# Patient Record
Sex: Female | Born: 1960 | Race: White | Hispanic: No | Smoking: Never smoker
Health system: Southern US, Community
[De-identification: ages and names within clinical notes are randomized; demographics above are authoritative.]

## PROBLEM LIST (undated history)

## (undated) DIAGNOSIS — G43909 Migraine, unspecified, not intractable, without status migrainosus: Secondary | ICD-10-CM

## (undated) DIAGNOSIS — K219 Gastro-esophageal reflux disease without esophagitis: Secondary | ICD-10-CM

## (undated) DIAGNOSIS — R112 Nausea with vomiting, unspecified: Secondary | ICD-10-CM

## (undated) DIAGNOSIS — F419 Anxiety disorder, unspecified: Secondary | ICD-10-CM

## (undated) DIAGNOSIS — R51 Headache: Secondary | ICD-10-CM

## (undated) DIAGNOSIS — Z9889 Other specified postprocedural states: Secondary | ICD-10-CM

## (undated) DIAGNOSIS — G4700 Insomnia, unspecified: Secondary | ICD-10-CM

## (undated) HISTORY — PX: TONSILLECTOMY: SUR1361

## (undated) HISTORY — PX: ELBOW ARTHROSCOPY: SUR87

---

## 1985-07-21 HISTORY — PX: DIAGNOSTIC LAPAROSCOPY: SUR761

## 1985-07-21 HISTORY — PX: APPENDECTOMY: SHX54

## 1997-11-03 ENCOUNTER — Other Ambulatory Visit: Admission: RE | Admit: 1997-11-03 | Discharge: 1997-11-03 | Payer: Self-pay | Admitting: Obstetrics and Gynecology

## 1997-11-06 ENCOUNTER — Other Ambulatory Visit: Admission: RE | Admit: 1997-11-06 | Discharge: 1997-11-06 | Payer: Self-pay | Admitting: Obstetrics and Gynecology

## 1998-11-02 ENCOUNTER — Ambulatory Visit (HOSPITAL_COMMUNITY): Admission: RE | Admit: 1998-11-02 | Discharge: 1998-11-02 | Payer: Self-pay | Admitting: Gastroenterology

## 1998-11-14 ENCOUNTER — Ambulatory Visit (HOSPITAL_COMMUNITY): Admission: RE | Admit: 1998-11-14 | Discharge: 1998-11-14 | Payer: Self-pay | Admitting: Obstetrics and Gynecology

## 1999-01-23 ENCOUNTER — Ambulatory Visit (HOSPITAL_COMMUNITY): Admission: RE | Admit: 1999-01-23 | Discharge: 1999-01-23 | Payer: Self-pay | Admitting: Gastroenterology

## 1999-01-24 ENCOUNTER — Other Ambulatory Visit: Admission: RE | Admit: 1999-01-24 | Discharge: 1999-01-24 | Payer: Self-pay | Admitting: Gynecology

## 1999-10-04 ENCOUNTER — Ambulatory Visit (HOSPITAL_BASED_OUTPATIENT_CLINIC_OR_DEPARTMENT_OTHER): Admission: RE | Admit: 1999-10-04 | Discharge: 1999-10-04 | Payer: Self-pay | Admitting: General Surgery

## 1999-10-11 ENCOUNTER — Encounter: Payer: Self-pay | Admitting: Neurology

## 1999-10-11 ENCOUNTER — Encounter: Admission: RE | Admit: 1999-10-11 | Discharge: 1999-10-11 | Payer: Self-pay | Admitting: Neurology

## 2000-01-27 ENCOUNTER — Other Ambulatory Visit: Admission: RE | Admit: 2000-01-27 | Discharge: 2000-01-27 | Payer: Self-pay | Admitting: Obstetrics and Gynecology

## 2000-08-05 ENCOUNTER — Ambulatory Visit (HOSPITAL_COMMUNITY): Admission: RE | Admit: 2000-08-05 | Discharge: 2000-08-05 | Payer: Self-pay | Admitting: Obstetrics and Gynecology

## 2001-01-26 ENCOUNTER — Other Ambulatory Visit: Admission: RE | Admit: 2001-01-26 | Discharge: 2001-01-26 | Payer: Self-pay | Admitting: Obstetrics and Gynecology

## 2002-02-10 ENCOUNTER — Other Ambulatory Visit: Admission: RE | Admit: 2002-02-10 | Discharge: 2002-02-10 | Payer: Self-pay | Admitting: Obstetrics and Gynecology

## 2002-08-10 ENCOUNTER — Ambulatory Visit (HOSPITAL_COMMUNITY): Admission: RE | Admit: 2002-08-10 | Discharge: 2002-08-10 | Payer: Self-pay | Admitting: Obstetrics and Gynecology

## 2002-12-06 ENCOUNTER — Encounter: Payer: Self-pay | Admitting: Obstetrics and Gynecology

## 2002-12-06 ENCOUNTER — Ambulatory Visit (HOSPITAL_COMMUNITY): Admission: RE | Admit: 2002-12-06 | Discharge: 2002-12-06 | Payer: Self-pay | Admitting: Obstetrics and Gynecology

## 2003-02-23 ENCOUNTER — Other Ambulatory Visit: Admission: RE | Admit: 2003-02-23 | Discharge: 2003-02-23 | Payer: Self-pay | Admitting: Obstetrics and Gynecology

## 2003-12-19 ENCOUNTER — Ambulatory Visit (HOSPITAL_COMMUNITY): Admission: RE | Admit: 2003-12-19 | Discharge: 2003-12-19 | Payer: Self-pay | Admitting: Obstetrics and Gynecology

## 2004-02-20 ENCOUNTER — Encounter: Payer: Self-pay | Admitting: Gastroenterology

## 2004-02-27 ENCOUNTER — Other Ambulatory Visit: Admission: RE | Admit: 2004-02-27 | Discharge: 2004-02-27 | Payer: Self-pay | Admitting: Obstetrics and Gynecology

## 2004-12-31 ENCOUNTER — Ambulatory Visit (HOSPITAL_COMMUNITY): Admission: RE | Admit: 2004-12-31 | Discharge: 2004-12-31 | Payer: Self-pay | Admitting: Obstetrics and Gynecology

## 2005-03-05 ENCOUNTER — Other Ambulatory Visit: Admission: RE | Admit: 2005-03-05 | Discharge: 2005-03-05 | Payer: Self-pay | Admitting: Obstetrics and Gynecology

## 2005-03-27 ENCOUNTER — Encounter: Admission: RE | Admit: 2005-03-27 | Discharge: 2005-06-25 | Payer: Self-pay | Admitting: Obstetrics and Gynecology

## 2006-01-02 ENCOUNTER — Ambulatory Visit (HOSPITAL_COMMUNITY): Admission: RE | Admit: 2006-01-02 | Discharge: 2006-01-02 | Payer: Self-pay | Admitting: Obstetrics and Gynecology

## 2006-03-06 ENCOUNTER — Other Ambulatory Visit: Admission: RE | Admit: 2006-03-06 | Discharge: 2006-03-06 | Payer: Self-pay | Admitting: Obstetrics and Gynecology

## 2006-10-12 ENCOUNTER — Ambulatory Visit: Payer: Self-pay | Admitting: Gastroenterology

## 2006-11-02 ENCOUNTER — Ambulatory Visit: Payer: Self-pay | Admitting: Gastroenterology

## 2006-11-03 ENCOUNTER — Encounter (INDEPENDENT_AMBULATORY_CARE_PROVIDER_SITE_OTHER): Payer: Self-pay | Admitting: Specialist

## 2006-11-03 ENCOUNTER — Encounter (INDEPENDENT_AMBULATORY_CARE_PROVIDER_SITE_OTHER): Payer: Self-pay | Admitting: *Deleted

## 2006-11-03 ENCOUNTER — Ambulatory Visit: Payer: Self-pay | Admitting: Gastroenterology

## 2006-11-13 ENCOUNTER — Ambulatory Visit: Payer: Self-pay | Admitting: Gastroenterology

## 2006-11-13 LAB — CONVERTED CEMR LAB
Basophils Absolute: 0 10*3/uL (ref 0.0–0.1)
Eosinophils Absolute: 0.3 10*3/uL (ref 0.0–0.6)
Eosinophils Relative: 3.5 % (ref 0.0–5.0)
HCT: 35.7 % — ABNORMAL LOW (ref 36.0–46.0)
Hemoglobin: 12.6 g/dL (ref 12.0–15.0)
Lymphocytes Relative: 31 % (ref 12.0–46.0)
MCHC: 35.3 g/dL (ref 30.0–36.0)
MCV: 95.8 fL (ref 78.0–100.0)
Monocytes Absolute: 0.6 10*3/uL (ref 0.2–0.7)
Neutrophils Relative %: 57.6 % (ref 43.0–77.0)
WBC: 7.6 10*3/uL (ref 4.5–10.5)

## 2006-11-30 ENCOUNTER — Ambulatory Visit: Payer: Self-pay | Admitting: Gastroenterology

## 2007-01-20 ENCOUNTER — Ambulatory Visit (HOSPITAL_COMMUNITY): Admission: RE | Admit: 2007-01-20 | Discharge: 2007-01-20 | Payer: Self-pay | Admitting: Obstetrics and Gynecology

## 2007-03-09 ENCOUNTER — Other Ambulatory Visit: Admission: RE | Admit: 2007-03-09 | Discharge: 2007-03-09 | Payer: Self-pay | Admitting: Obstetrics and Gynecology

## 2008-02-22 ENCOUNTER — Ambulatory Visit (HOSPITAL_COMMUNITY): Admission: RE | Admit: 2008-02-22 | Discharge: 2008-02-22 | Payer: Self-pay | Admitting: Obstetrics and Gynecology

## 2008-02-22 IMAGING — MG MM DIGITAL SCREENING BILAT
4 series · 4 of 4 positions shown · non-contrast
Comparison: Prior studies.

DG SCREEN MAMMOGRAM BILATERAL
Bilateral CC and MLO view(s) were taken.
Technologist: LAMBERTIN, RT, RM

DIGITAL SCREENING MAMMOGRAM WITH CAD:

[R CC]
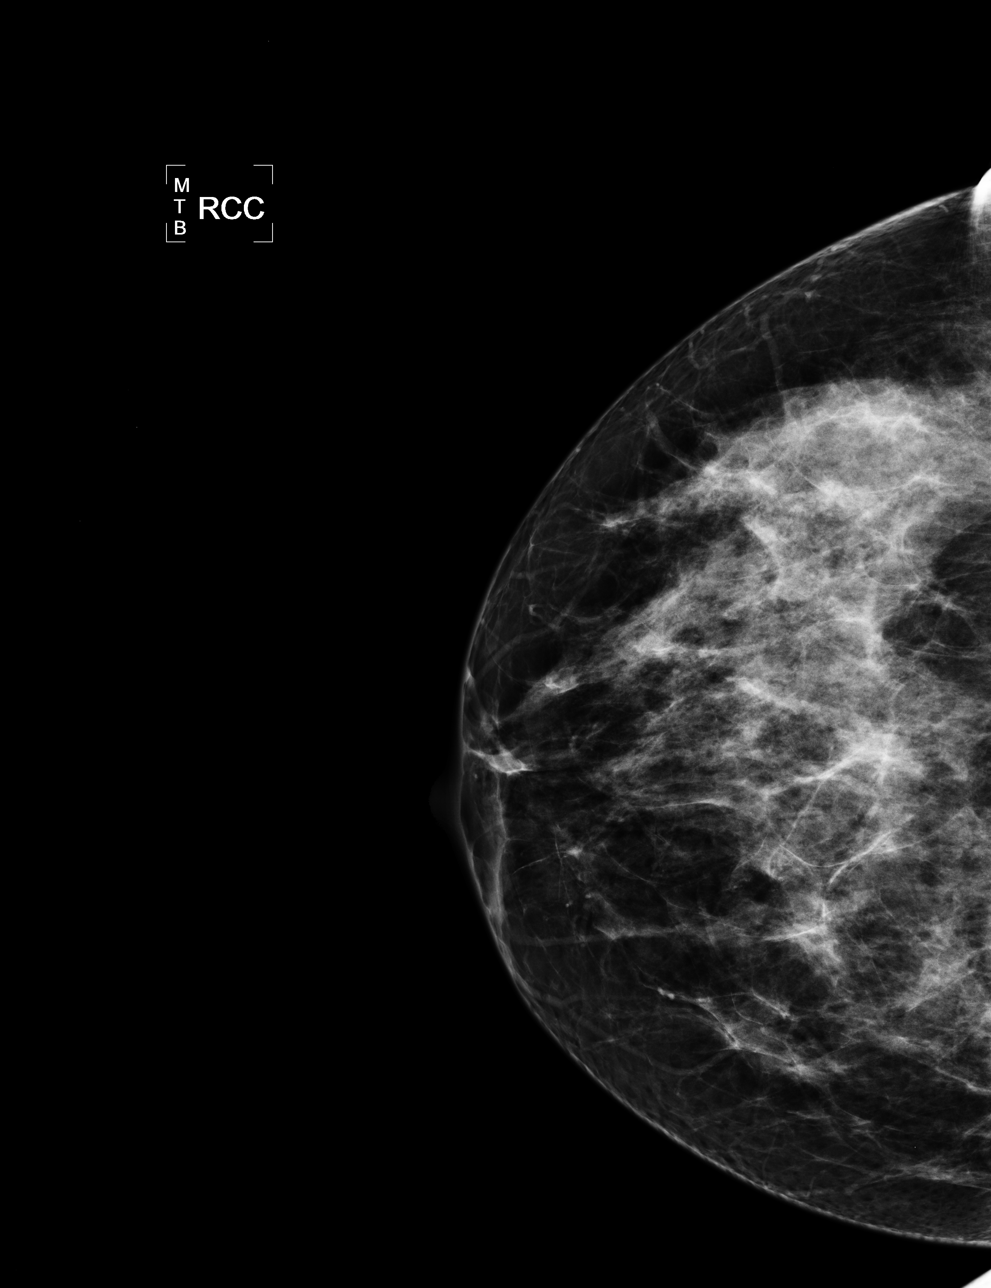

[R MLO]
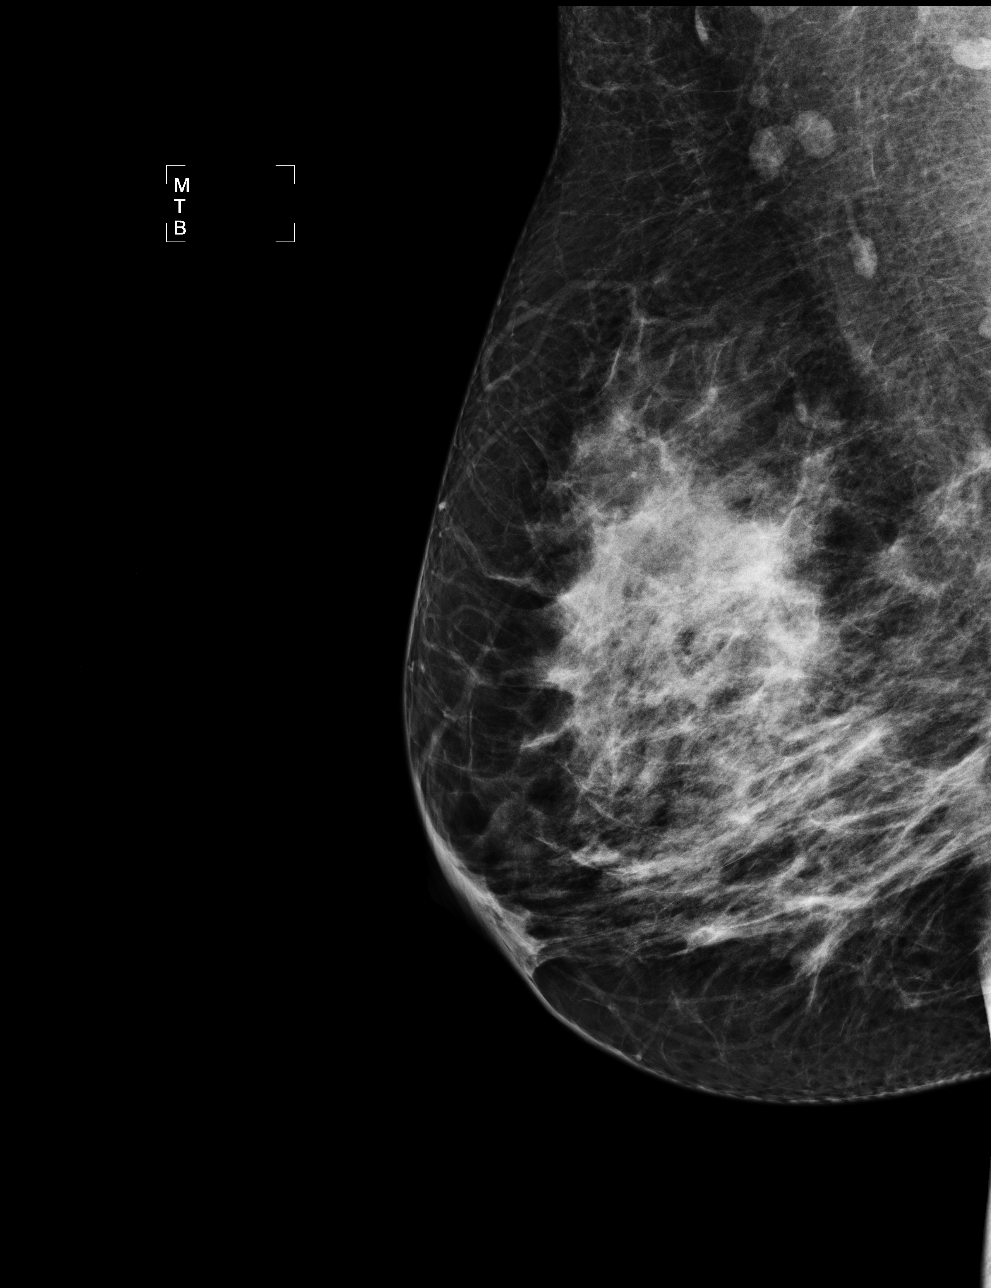

[L CC]
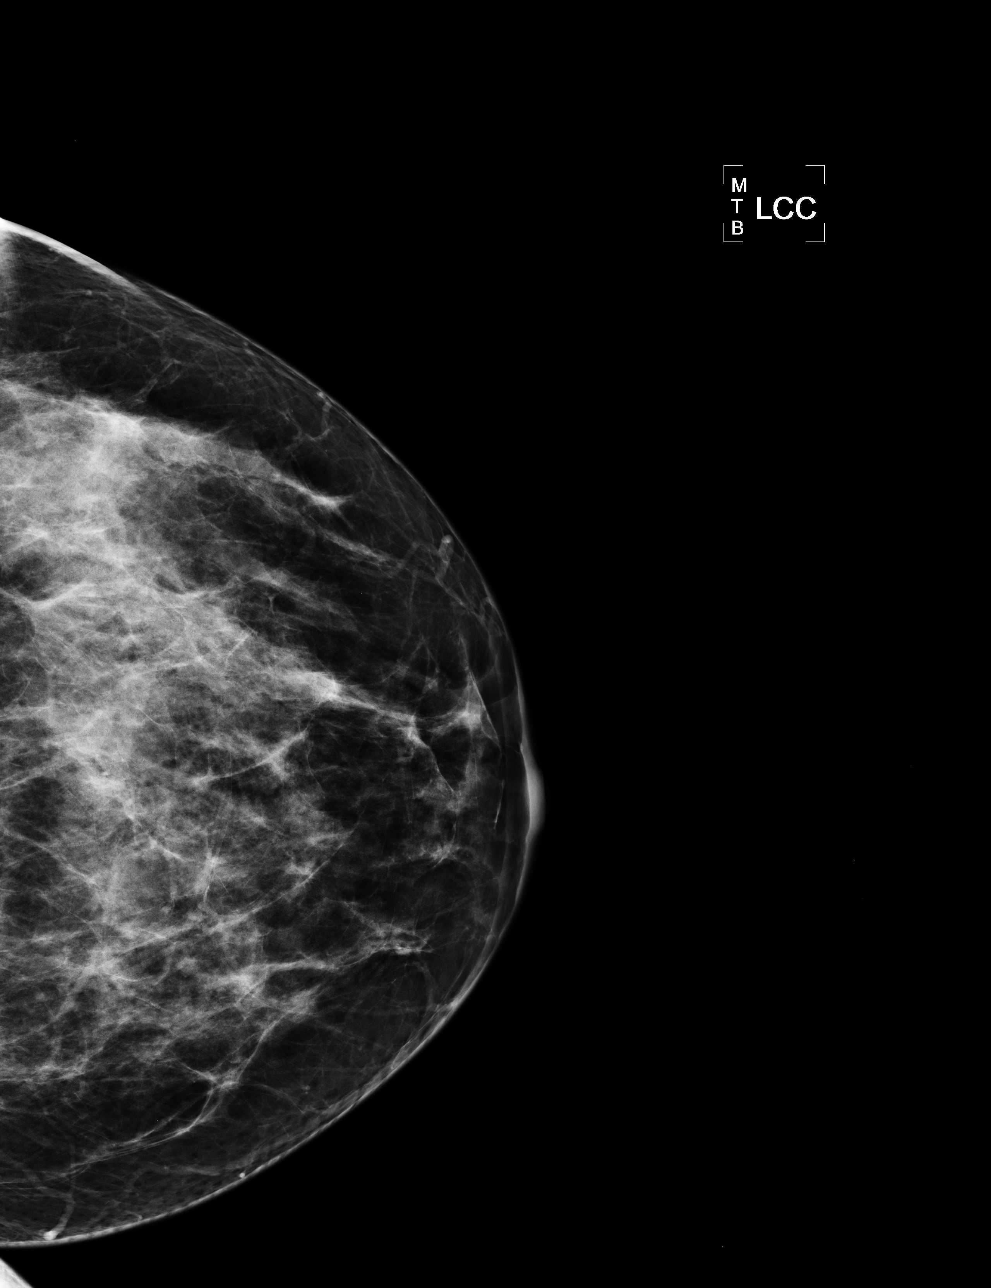

[L MLO]
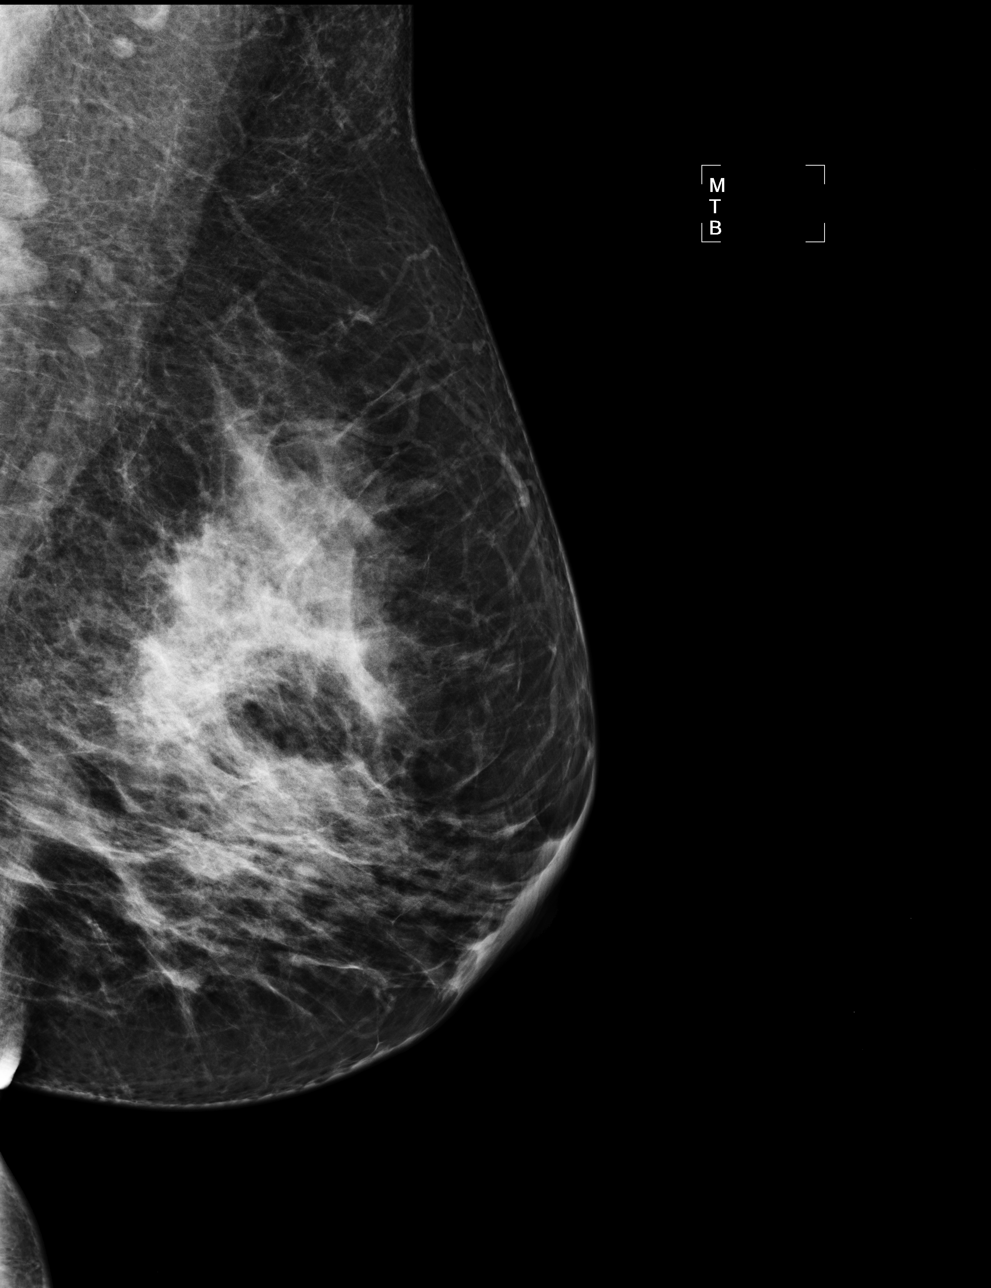

[4 of 4 positions shown; findings below may reference images not displayed]

The breast tissue is heterogeneously dense.  There is no dominant mass, architectural distortion or
calcification to suggest malignancy.
IMPRESSION: No mammographic evidence of malignancy.  Suggest yearly screening mammography.

ASSESSMENT: Negative - BI-RADS 1

Screening mammogram in 1 year.
ANALYZED BY COMPUTER AIDED DETECTION. , THIS PROCEDURE WAS A DIGITAL MAMMOGRAM.

## 2009-03-20 ENCOUNTER — Ambulatory Visit (HOSPITAL_COMMUNITY): Admission: RE | Admit: 2009-03-20 | Discharge: 2009-03-20 | Payer: Self-pay | Admitting: Obstetrics and Gynecology

## 2010-04-01 ENCOUNTER — Encounter: Payer: Self-pay | Admitting: Gastroenterology

## 2010-04-01 ENCOUNTER — Encounter (INDEPENDENT_AMBULATORY_CARE_PROVIDER_SITE_OTHER): Payer: Self-pay | Admitting: *Deleted

## 2010-04-25 ENCOUNTER — Ambulatory Visit (HOSPITAL_COMMUNITY)
Admission: RE | Admit: 2010-04-25 | Discharge: 2010-04-25 | Payer: Self-pay | Source: Home / Self Care | Admitting: Obstetrics and Gynecology

## 2010-05-14 ENCOUNTER — Encounter (INDEPENDENT_AMBULATORY_CARE_PROVIDER_SITE_OTHER): Payer: Self-pay | Admitting: *Deleted

## 2010-06-25 ENCOUNTER — Ambulatory Visit: Payer: Self-pay | Admitting: Gastroenterology

## 2010-06-25 DIAGNOSIS — R002 Palpitations: Secondary | ICD-10-CM | POA: Insufficient documentation

## 2010-07-08 DIAGNOSIS — R42 Dizziness and giddiness: Secondary | ICD-10-CM | POA: Insufficient documentation

## 2010-07-08 DIAGNOSIS — K279 Peptic ulcer, site unspecified, unspecified as acute or chronic, without hemorrhage or perforation: Secondary | ICD-10-CM | POA: Insufficient documentation

## 2010-07-09 ENCOUNTER — Ambulatory Visit: Payer: Self-pay | Admitting: Cardiovascular Disease

## 2010-07-09 ENCOUNTER — Telehealth: Payer: Self-pay | Admitting: Gastroenterology

## 2010-07-09 ENCOUNTER — Encounter: Payer: Self-pay | Admitting: Cardiovascular Disease

## 2010-07-30 ENCOUNTER — Encounter: Payer: Self-pay | Admitting: Gastroenterology

## 2010-07-30 ENCOUNTER — Ambulatory Visit (HOSPITAL_COMMUNITY)
Admission: RE | Admit: 2010-07-30 | Discharge: 2010-07-30 | Payer: Self-pay | Source: Home / Self Care | Attending: Gastroenterology | Admitting: Gastroenterology

## 2010-07-31 ENCOUNTER — Encounter: Payer: Self-pay | Admitting: Gastroenterology

## 2010-08-22 NOTE — Procedures (Signed)
Summary: Bravo pH Monitoring Study/[employer]  Bravo pH Monitoring Study/[place]   Imported By: Sherian Rein 08/14/2010 14:54:03  _____________________________________________________________________  External Attachment:    Type:   Image     Comment:   External Document

## 2010-08-22 NOTE — Letter (Signed)
Summary: New Patient letter  [facility]  [address] [place], [place] [address]   Phone: [phone]  Fax: [phone]       04/01/2010 MRN: [id number]  [name] [name] [address] [place], [place]  [address]  Dear Ms. [name] [name],  Welcome to the Gastroenterology Division at [facility].    You are scheduled to see Dr. Arlyce Dice on 05/09/2010 at 8:30AM on the 3rd floor at [facility], [address].  We ask that you try to arrive at our office 15 minutes prior to your appointment time to allow for check-in.  We would like you to complete the enclosed self-administered evaluation form prior to your visit and bring it with you on the day of your appointment.  We will review it with you.  Also, please bring a complete list of all your medications or, if you prefer, bring the medication bottles and we will list them.  Please bring your insurance card so that we may make a copy of it.  If your insurance requires a referral to see a specialist, please bring your referral form from your primary care physician.  Co-payments are due at the time of your visit and may be paid by cash, check or credit card.     Your office visit will consist of a consult with your physician (includes a physical exam), any laboratory testing he/she may order, scheduling of any necessary diagnostic testing (e.g. x-ray, ultrasound, CT-scan), and scheduling of a procedure (e.g. Endoscopy, Colonoscopy) if required.  Please allow enough time on your schedule to allow for any/all of these possibilities.    If you cannot keep your appointment, please call [phone] to cancel or reschedule prior to your appointment date.  This allows [place] the opportunity to schedule an appointment for another patient in need of care.  If you do not cancel or reschedule by 5 p.m. the business day prior to your appointment date, you will be charged a $50.00 late cancellation/no-show fee.    Thank you for  choosing [facility] for your medical needs.  We appreciate the opportunity to care for you.  Please visit [place] at our website  to learn more about our practice.                     Sincerely,                                                             The Gastroenterology Division

## 2010-08-22 NOTE — Letter (Signed)
Summary: Appt Reminder 2  [facility]  [address] [place], [place] [address]   Phone: [phone]  Fax: [phone]        July 31, 2010 MRN: [id number]    KARISA NESSER [address] [place], [place]  [address]    Dear Ms. Patman,   You have a return appointment with Dr. Arlyce Dice on 09/02/10 at 2:15pm.  Please remember to bring a complete list of the medicines you are taking, your insurance card and your co-pay.  If you have to cancel or reschedule this appointment, please call before 5:00 pm the evening before to avoid a cancellation fee.  If you have any questions or concerns, please call [phone].    Sincerely,    Selinda Michaels RN  Appended Document: Appt Reminder 2 Letter is mailed to the patient's home address

## 2010-08-22 NOTE — Procedures (Signed)
Summary: Upper Endoscopy  Patient: [name] [name] Note: All result statuses are Final unless otherwise noted.  Tests: (1) Upper Endoscopy (EGD)   EGD Upper Endoscopy       DONE     [facility]     [address] [place], [place]  [address]           ENDOSCOPY PROCEDURE REPORT           PATIENT:  [name] [name], Span  MR#:  [id number]     BIRTHDATE:  08-28-1960, 49 yrs. old  GENDER:  female           ENDOSCOPIST:  Barbette Hair. Arlyce Dice, MD     Referred by:  Chauncy Passy, M.D.           PROCEDURE DATE:  07/30/2010     PROCEDURE:  Bravo pH probe placement     ASA CLASS:  Class II     INDICATIONS:           MEDICATIONS:   Fentanyl 100 mcg IV, Versed 10 mg IV,     glycopyrrolate (Robinal) 0.2 mg IV     TOPICAL ANESTHETIC:  Cetacaine Spray           DESCRIPTION OF PROCEDURE:   After the risks benefits and     alternatives of the procedure were thoroughly explained, informed     consent was obtained.  The  endoscope was introduced through the     mouth and advanced to the third portion of the duodenum, without     limitations.  The instrument was slowly withdrawn as the mucosa     was fully examined.           The upper, middle, and distal third of the esophagus were     carefully inspected and no abnormalities were noted. The z-line     was well seen at the GEJ. The endoscope was pushed into the fundus     which was normal including a retroflexed view. The antrum,gastric     body, first and second part of the duodenum were unremarkable. GE     junction at 42cm. Bravo pH probe was placed at 35cm from incisors     Retroflexed views revealed no abnormalities.    The scope was then     withdrawn from the patient and the procedure completed.           COMPLICATIONS:  None           ENDOSCOPIC IMPRESSION:     1) Normal EGD     RECOMMENDATIONS:     1) continue PPI     2) Call office next 2-3 days to schedule an office appointment     for 3-4 weeks           REPEAT EXAM:   No           ______________________________     Barbette Hair. Arlyce Dice, MD           CC:           n.     eSIGNED:   Barbette Hair. Kaplan at 07/30/2010 12:54 PM           Erasmo Score, [id number]  Note: An exclamation mark (!) indicates a result that was not dispersed into the flowsheet. Document Creation Date: 07/30/2010 12:54 PM _______________________________________________________________________  (1) Order result status: Final Collection or observation date-time: 07/30/2010 12:50 Requested date-time:  Receipt date-time:  Reported date-time:  Referring Physician:   Ordering Physician: Melvia Heaps [phone]) Specimen Source:  Source: Launa Grill Order Number: [id number] Lab site:

## 2010-08-22 NOTE — Procedures (Signed)
Summary: Colonoscopy   Colonoscopy  Procedure date:  11/03/2006  Findings:      Results: Colitis.       Location:  [facility].    Colonoscopy  Procedure date:  11/03/2006  Findings:      Results: Colitis.       Location:  [facility].   Patient Name: [name] [name], [name] [name] MRN:  Procedure Procedures: Colonoscopy CPT: 01027.    with biopsy. CPT: Q5068410.  Personnel: Endoscopist: Barbette Hair. Arlyce Dice, MD.  Patient Consent: Procedure, Alternatives, Risks and Benefits discussed, consent obtained, from patient.  Indications Symptoms: Constipation Diarrhea  History  Current Medications: Patient is not currently taking Coumadin.  Pre-Exam Physical: Performed Nov 03, 2006. Cardio-pulmonary exam, HEENT exam , Abdominal exam, Mental status exam WNL.  Comments: Patient history reviewed/updated, physical performed prior to initiation of sedation?yes Exam Exam: Extent of exam reached: Cecum, extent intended: Cecum.  The cecum was identified by appendiceal orifice and IC valve. Time to Cecum: 00:03: 26. Time for Withdrawl: 00:06:54. Colon retroflexion performed. ASA Classification: I. Tolerance: good.  Monitoring: Pulse and BP monitoring, Oximetry used. Supplemental O2 given. at 2 Liters.  Colon Prep Used Miralax for colon prep. Prep results: good.  Sedation Meds: Patient assessed and found to be appropriate for moderate (conscious) sedation. Sedation was managed by the Endoscopist. Fentanyl 100 mcg. given IV. Versed 8 mg. given IV.  Findings - NORMAL EXAM: Cecum to Descending Colon.  - MUCOSAL ABNORMALITY: Descending Colon to Sigmoid Colon. Erythema present. Erosions absent, Granularity absent, bleeding absent, Edema present. Biopsy/Mucosal Abn. taken. ICD9: Colitis, Unspecified: 558. 9. Comments: Faint erythema and edema beginning at 15cm from anus.  NORMAL EXAM: Cecum.  - NORMAL EXAM: Sigmoid Colon to Rectum.   Assessment Abnormal examination,  see findings above.  Diagnoses: 558.9: Colitis, Unspecified.   Events  Unplanned Interventions: No intervention was required.  Unplanned Events: There were no complications. Plans  Post Exam Instructions: Post sedation instructions given.  Medication Plan: Anti-constipation: Miralax 17 BID, starting Nov 03, 2006   Disposition: After procedure patient sent to recovery. After recovery patient sent home.  Scheduling/Referral: Office Visit, to [employer]. Arlyce Dice, MD, around Nov 17, 2006.    cc:   The Patient   Syliva Overman, MD   Sylvester Harder, MD   This report was created from the original endoscopy report, which was reviewed and signed by the above listed endoscopist.

## 2010-08-22 NOTE — Letter (Signed)
Summary: Marvis Repress MD  Marvis Repress MD   Imported By: Lester Rocky 04/17/2010 11:49:40  _____________________________________________________________________  External Attachment:    Type:   Image     Comment:   External Document

## 2010-08-22 NOTE — Procedures (Signed)
Summary: EGD   EGD  Procedure date:  02/20/2004  Findings:      Findings: Gastritis  Location: [facility]    Patient Name: [name], [name] MRN:  Procedure Procedures: Panendoscopy (EGD) CPT: 43235.    with biopsy(s)/brushing(s). CPT: D1846139.  Personnel: Endoscopist: Barbette Hair. Arlyce Dice, MD.  Indications Symptoms: Reflux symptoms  History  Current Medications: Patient is not currently taking Coumadin.  Pre-Exam Physical: Performed Feb 20, 2004  Entire physical exam was normal.  Exam Exam Info: Maximum depth of insertion Duodenum, intended Duodenum. Vocal cords visualized. Gastric retroflexion performed. ASA Classification: I. Tolerance: good.  Sedation Meds: Robinul 0.2 given IV. Fentanyl 75 mcg. given IV. Versed 8 mg. given IV. Cetacaine Spray 2 sprays given aerosolized.  Monitoring: BP and pulse monitoring done. Oximetry used. Supplemental O2 given at 2 Liters.  Findings - MUCOSAL ABNORMALITY: Body to Antrum. Erosions present. Erythematous mucosa. Biopsy/Mucosal Abn taken. ICD9: Gastritis, Unspecified: 535. 50. Comment: Streaky erythema and single 2mm erosion.   Assessment Abnormal examination, see findings above.  Diagnoses: 535.50: Gastritis, Unspecified.   Events  Unplanned Intervention: No unplanned interventions were required.  Unplanned Events: There were no complications. Plans Medication(s): Await pathology.  Comments: Per protocol Comments: Per protocol

## 2010-08-22 NOTE — Procedures (Signed)
Summary: Instruction for procedure/[place]  Instruction for procedure/[place]   Imported By: Sherian Rein 06/28/2010 08:55:39  _____________________________________________________________________  External Attachment:    Type:   Image     Comment:   External Document

## 2010-08-22 NOTE — Assessment & Plan Note (Signed)
Summary: NP3/PALPITIONS/[employer]/[place]  Medications Added PAXIL 10 MG TABS (PAROXETINE HCL) 1 tab by mouth once daily * DESYREL SLEEP AID as needed      Allergies Added:   Primary Provider:  Landry Corporal  CC:  dizziness and sob and chest pains.  History of Present Illness: Anxious 50 yo with palpitations.  Previously with palpitations related to caffeine improved with less caffeine.  Recurrent about 3 months ago.  No SSCP, dyspnea. Not related to exertion.  Prominant at night and when still.  No syncope or family history of sudden death.  No history of valvular disease or CHF.  Normal ECG.  Can be worse with stress.  No recent cardiac w/u  Current Problems (verified): 1)  Palpitations  (ICD-785.1) 2)  Chest Pain Unspecified  (ICD-786.50) 3)  Vertigo  (ICD-780.4) 4)  Pud  (ICD-533.90) 5)  Abdominal Pain  (ICD-789.00) 6)  Nausea  (ICD-787.02)  Current Medications (verified): 1)  Lyrica 50 Mg Caps (Pregabalin) .Marland Kitchen.. 1 By Mouth Once Daily 2)  Paxil 10 Mg Tabs (Paroxetine Hcl) .Marland Kitchen.. 1 Tab By Mouth Once Daily 3)  Multivitamins  Tabs (Multiple Vitamin) .Marland Kitchen.. 1 By Mouth Once Daily 4)  Pantoprazole Sodium 40 Mg Tbec (Pantoprazole Sodium) .... Take 1 Tablet By Mouth Once A Day 5)  Topamax 100 Mg Tabs (Topiramate) .... Take 1 Tab By Mouth At Bedtime 6)  Loestrin 24 Fe 1-20 Mg-Mcg Tabs (Norethin Ace-Eth Estrad-Fe) .... Take 1 Tablet By Mouth Once A Day 7)  Valtrex 500 Mg Tabs (Valacyclovir Hcl) .... Take 1 Tablet By Mouth Once A Day 8)  Axert 12.5 Mg Tabs (Almotriptan Malate) .... As Needed For Migraine 9)  Nabumetone 500 Mg Tabs (Nabumetone) .... As Needed For Migraine 10)  Oxycodone-Acetaminophen 5-325 Mg Tabs (Oxycodone-Acetaminophen) .... As Needed For Migraine 11)  Promethazine Hcl 25 Mg Tabs (Promethazine Hcl) .... As Needed For Nausea 12)  Xanax 0.25 Mg Tabs (Alprazolam) .... As Needed For Anxiety 13)  Fibercon 625 Mg Tabs (Calcium Polycarbophil) .... Take 1 Tab By Mouth At Bedtime 14)   Ferrous Sulfate 325 (65 Fe) Mg Tabs (Ferrous Sulfate) .... Take 1 Tab By Mouth At Bedtime 15)  Estroven  Tabs (Nutritional Supplements) .... Take 1 Tab By Mouth At Bedtime 16)  Desyrel Sleep Aid .... As Needed  Allergies (verified): 1)  ! Ibuprofen  Past History:  Past Medical History: Last updated: 07/08/2010 PALPITATIONS CHEST PAIN UNSPECIFIED VERTIGO  PUD ABDOMINAL PAIN NAUSEA   Past Surgical History: Last updated: 07/08/2010 Appendectomy Tonsillectomy Rt elbow surgery Ruptured ovarian cyst April 1984, presumably with laparoscopy.  Numerous laparoscopies as mentioned above. Repair of fissures.  Right oophorectomy July 1997 by Dr. Ashley Royalty.  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  Alcohol Use - no Illicit Drug Use - no  Review of Systems       Denies fever, malais, weight loss, blurry vision, decreased visual acuity, cough, sputum, SOB, hemoptysis, pleuritic pain,, heartburn, abdominal pain, melena, lower extremity edema, claudication, or rash.   Vital Signs:  Patient profile:   50 year old female Height:      63 inches Weight:      117 pounds BMI:     20.80 Pulse rate:   69 / minute Resp:     14 per minute BP sitting:   135 / 82  (  left arm)  Vitals Entered By: Kem Parkinson (July 09, 2010 11:53 AM)  Physical Exam  General:  Affect appropriate Healthy:  appears stated age HEENT: normal Neck supple with no adenopathy JVP normal no bruits no thyromegaly Lungs clear with no wheezing and good diaphragmatic motion Heart:  S1/S2 no murmur,rub, gallop or click PMI normal Abdomen: benighn, BS positve, no tenderness, no AAA no bruit.  No HSM or HJR Distal pulses intact with no bruits No edema Neuro non-focal Skin warm and dry    Impression  & Recommendations:  Problem # 1:  PALPITATIONS (ICD-785.1) Benign.  Doubt structural heart disease.  F/U echo and stress echo.  Problem # 2:  CHEST PAIN UNSPECIFIED (ICD-786.50) Normal ECG.  Previous issue not prominant with current palpitaitons.  Normal ECG  stress echo more to R/O exercise induced arrythmia  Patient Instructions: 1)  Your physician recommends that you schedule a follow-up appointment as needed 2)  Your physician recommends that you continue on your current medications as directed. Please refer to the Current Medication list given to you today.

## 2010-08-22 NOTE — Letter (Signed)
Summary: New Patient letter  [facility]  [address] [place], [place] [address]   Phone: [phone]  Fax: [phone]       05/14/2010 MRN: [id number]  [name] [name] [address] [place], [place]  [address]  Dear Ms. [name] [name],  Welcome to the Gastroenterology Division at [facility].    You are scheduled to see Dr. Arlyce Dice on 06/25/2010 at 2:00PM on the 3rd floor at [facility], [address].  We ask that you try to arrive at our office 15 minutes prior to your appointment time to allow for check-in.  We would like you to complete the enclosed self-administered evaluation form prior to your visit and bring it with you on the day of your appointment.  We will review it with you.  Also, please bring a complete list of all your medications or, if you prefer, bring the medication bottles and we will list them.  Please bring your insurance card so that we may make a copy of it.  If your insurance requires a referral to see a specialist, please bring your referral form from your primary care physician.  Co-payments are due at the time of your visit and may be paid by cash, check or credit card.     Your office visit will consist of a consult with your physician (includes a physical exam), any laboratory testing he/she may order, scheduling of any necessary diagnostic testing (e.g. x-ray, ultrasound, CT-scan), and scheduling of a procedure (e.g. Endoscopy, Colonoscopy) if required.  Please allow enough time on your schedule to allow for any/all of these possibilities.    If you cannot keep your appointment, please call [phone] to cancel or reschedule prior to your appointment date.  This allows [place] the opportunity to schedule an appointment for another patient in need of care.  If you do not cancel or reschedule by 5 p.m. the business day prior to your appointment date, you will be charged a $50.00 late cancellation/no-show fee.    Thank you for  choosing [facility] for your medical needs.  We appreciate the opportunity to care for you.  Please visit [place] at our website  to learn more about our practice.                     Sincerely,                                                             The Gastroenterology Division

## 2010-08-22 NOTE — Assessment & Plan Note (Signed)
Summary: nausea...as.   History of Present Illness Visit Type: new patient Primary GI MD: Melvia Heaps MD [facility] Primary Provider: Landry [name] Requesting Provider: n/a Chief Complaint: non-cardiac chest pain, pt was seen at [facility] on Friday and given GI coctail that she responded well to.  Aches in ribcage. History of Present Illness:   [name] [name] is a 50 year old white female referred at the request of Dr. Dell Ponto for palpitations and abdominal pain.  She was seen in the ER and underwent a cardiac workup including enzymes which were normal.  She was given a GI cocktail with improvement of her discomfort.  She denies sore throat coughing or dysphagia.  She is on no gastric irritants.  She hads had upper GI issues for years including anorexia, pyrosis and nausea.  For the past 3 weeks she has been complaining of constant palpitations.  5 days ago she developed chest discomfort along with her palpitations.  Pain is described as sharp pain that would radiate to her neck and left arm.  It would worsen with deep breathing. She also c/o  nausea,  belching and pyrosis.  She takes PPIs chronically.  Since changing to Protonix 5 days ago GI symptoms have decreased.   GI Review of Systems    Reports abdominal pain, acid reflux, belching, bloating, chest pain, heartburn, loss of appetite, and  nausea.      Denies dysphagia with liquids, dysphagia with solids, vomiting, vomiting blood, weight loss, and  weight gain.      Reports constipation and  diarrhea.     Denies anal fissure, black tarry stools, change in bowel habit, diverticulosis, fecal incontinence, heme positive stool, hemorrhoids, irritable bowel syndrome, jaundice, light color stool, liver problems, rectal bleeding, and  rectal pain. Preventive Screening-Counseling & Management  Alcohol-Tobacco     Smoking Status: never      Drug Use:  no.      Current Medications (verified): 1)  Lyrica 50 Mg Caps (Pregabalin) .Marland Kitchen..  1 By Mouth Once Daily 2)  Paxil 20 Mg Tabs (Paroxetine Hcl) .Marland Kitchen.. 1 By Mouth Once Daily 3)  Multivitamins  Tabs (Multiple Vitamin) .Marland Kitchen.. 1 By Mouth Once Daily 4)  Pantoprazole Sodium 40 Mg Tbec (Pantoprazole Sodium) .... Take 1 Tablet By Mouth Once A Day 5)  Topamax 100 Mg Tabs (Topiramate) .... Take 1 Tab By Mouth At Bedtime 6)  Loestrin 24 Fe 1-20 Mg-Mcg Tabs (Norethin Ace-Eth Estrad-Fe) .... Take 1 Tablet By Mouth Once A Day 7)  Valtrex 500 Mg Tabs (Valacyclovir Hcl) .... Take 1 Tablet By Mouth Once A Day 8)  Axert 12.5 Mg Tabs (Almotriptan Malate) .... As Needed For Migraine 9)  Nabumetone 500 Mg Tabs (Nabumetone) .... As Needed For Migraine 10)  Oxycodone-Acetaminophen 5-325 Mg Tabs (Oxycodone-Acetaminophen) .... As Needed For Migraine 11)  Promethazine Hcl 25 Mg Tabs (Promethazine Hcl) .... As Needed For Nausea 12)  Xanax 0.25 Mg Tabs (Alprazolam) .... As Needed For Anxiety 13)  Fibercon 625 Mg Tabs (Calcium Polycarbophil) .... Take 1 Tab By Mouth At Bedtime 14)  Ferrous Sulfate 325 (65 Fe) Mg Tabs (Ferrous Sulfate) .... Take 1 Tab By Mouth At Bedtime 15)  Estroven  Tabs (Nutritional Supplements) .... Take 1 Tab By Mouth At Bedtime  Allergies (verified): 1)  ! 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  Alcohol Use - no Illicit Drug Use - no Smoking Status:  never Drug Use:  no  Review of Systems       The patient complains of allergy/sinus, fatigue, heart rhythm changes, sleeping problems, and thirst - excessive.  The patient denies anemia, anxiety-new, arthritis/joint pain, back pain, blood in urine, breast changes/lumps, confusion, cough,  coughing up blood, depression-new, fainting, fever, headaches-new, hearing problems, heart murmur, itching, menstrual pain, muscle pains/cramps, night sweats, nosebleeds, pregnancy symptoms, shortness of breath, skin rash, sore throat, swelling of feet/legs, swollen lymph glands, urination - excessive, urination changes/pain, urine leakage, vision changes, and voice change.         All other systems were reviewed and were negative   Vital Signs:  Patient profile:   50 year old female Height:      63 inches Weight:      119 pounds BMI:     21.16 Pulse rate:   68 / minute Pulse rhythm:   regular BP sitting:   120 / 72  (left arm) Cuff size:   regular  Vitals Entered By: Francee Piccolo CMA Duncan Dull) (June 25, 2010 2:07 PM)  Physical Exam  Additional Exam:  on exam she is a well-developed well-nourished female  skin: anicteric HEENT: normocephalic; PEERLA; no nasal or pharyngeal abnormalities neck: supple nodes: no cervical lymphadenopathy chest: clear to ausculatation and percussion heart: no murmurs, gallops, or rubs abd: soft, nontender; BS normoactive; no abdominal masses, tenderness, organomegaly rectal: deferred ext: no cynanosis, clubbing, edema skeletal: no deformities; there is mild chest wall tenderness to palpation at  the manubrium neuro: oriented x 3; no focal abnormalities    Impression & Recommendations:  Problem # 1:  CHEST PAIN UNSPECIFIED (ICD-786.50)  The patient has atypical chest pain that may have several components: She appeared to have chest wall tenderness.  Her response to a GI cocktail certainly raises the question of gastroesophageal reflux with dyspepsia.  Cholecystitis and pericarditis are much less likely. There may also be an anxiety component.  Cardiac ischemia is very unlikely.  Recommendations #1 continue Protonix #2 upper endoscopy with probable pH probe while continuing Protonix  Orders: Bravo Ph Probe (Bravo Ph) ZEGD  (ZEGD)  Problem # 2:  PALPITATIONS (ICD-785.1) Patient has scheduled appointment with cardiology.  I would consider echocardiography.  Patient Instructions: 1)  Copy sent to : [name] [name] 2)  Your EGD with Bravo PH is scheduled for 07/30/2010 at 12:30pm at [facility]. 3)  Continue Protonix 4)  Conscious Sedation brochure given.  5)  Upper Endoscopy brochure given.  6)  The medication list was reviewed and reconciled.  All changed / newly prescribed medications were explained.  A complete medication list was provided to the patient / caregiver.

## 2010-08-22 NOTE — Letter (Signed)
Summary: EGD/BRAVO [facility]  Instructions  [facility]  [address] [place], [place] [address]   Phone: [phone]  Fax: [phone]       [name] [name]    09-14-1960    MRN: [id number]       Procedure Day /Date:TUESDAY 07/30/2010     Arrival Time: 11:30AM     Procedure Time:12:30PM     Location of Procedure:                     X  [facility] ( Outpatient Registration)   PREPARATION FOR ENDOSCOPY/BRAVO [facility]   On1/04/2011  THE DAY OF THE PROCEDURE:  1.   No solid foods, milk or milk products are allowed after midnight the night before your procedure.  2.   Do not drink anything colored red or purple.  Avoid juices with pulp.  No orange juice.  3.  You may drink clear liquids until8:30AM , which is 4 hours before your procedure.                                                                                                CLEAR LIQUIDS INCLUDE: Water Jello Ice Popsicles Tea (sugar ok, no milk/cream) Powdered fruit flavored drinks Coffee (sugar ok, no milk/cream) Gatorade Juice: apple, white grape, white cranberry  Lemonade Clear bullion, consomm, broth Carbonated beverages (any kind) Strained chicken noodle soup Hard Candy   MEDICATION INSTRUCTIONS  Unless otherwise instructed, you should take regular prescription medications with a small sip of water as early as possible the morning of your procedure.              OTHER INSTRUCTIONS  You will need a responsible adult at least 49 years of age to accompany you and drive you home.   This person must remain in the waiting room during your procedure.  Wear loose fitting clothing that is easily removed.  Leave jewelry and other valuables at home.  However, you may wish to bring a book to read or an iPod/MP3 player to listen to music as you wait for your procedure to start.  Remove all body piercing jewelry and leave at home.  Total time from sign-in until discharge is approximately 2-3  hours.  You should go home directly after your procedure and rest.  You can resume normal activities the day after your procedure.  The day of your procedure you should not:   Drive   Make legal decisions   Operate machinery   Drink alcohol   Return to work  You will receive specific instructions about eating, activities and medications before you leave.    The above instructions have been reviewed and explained to me by   _______________________    I fully understand and can verbalize these instructions _____________________________ Date _________

## 2010-08-22 NOTE — Progress Notes (Signed)
Summary: Triage   Phone Note Call from Patient Call back at Home Phone [phone]   Caller: Patient Call For: Dr. Arlyce Dice Reason for Call: Talk to Nurse Summary of Call: Wants to know if she can take some steriods with her IBS Initial call taken by: Karna Christmas,  July 09, 2010 4:04 PM  Follow-up for Phone Call        Patient called and wants to know if it is alright for her to take either some steroid gel or oral steroids for some chest wall pain that she saw her family doc for, wants to make sure that steroids will not mess up results from her procedure that she has scheduled with you. Patient scheduled for EGD/BRAVO for 07/30/10. Dr. Arlyce Dice please advise.  Follow-up by: Selinda Michaels RN,  July 09, 2010 4:45 PM  Additional Follow-up for Phone Call Additional follow up Details #1::        ok to take meds Additional Follow-up by: Louis Meckel MD,  July 10, 2010 8:43 AM    Additional Follow-up for Phone Call Additional follow up Details #2::    Left message to call back Selinda Michaels, RN  Informed patient and let her know it is ok to try the steroid gel or the oral steroids. Per Dr. Arlyce Dice it will not interfere with the procedure.  Follow-up by: Selinda Michaels RN,  July 10, 2010 9:13 AM

## 2010-08-23 NOTE — Op Note (Signed)
  NAME:  KEWANDA, POLAND NO.:  [id number]  MEDICAL RECORD NO.:  [id number]          PATIENT TYPE:  AMB  LOCATION:  ENDO                         FACILITY:  [facility]  PHYSICIAN:  Barbette Hair. Arlyce Dice, MD,FACGDATE OF BIRTH:  February 28, 1961  DATE OF PROCEDURE:  07/30/2010 DATE OF DISCHARGE:  07/30/2010                              OPERATIVE REPORT   The Bravo pH study was performed while the patient was taking her Protonix.  On day one there were 16 episodes of reflux.  The fraction of time below pH4 was 67.2, 82% was in the supine position.  Total Dimitri score was 187.  There were no readings from day two.  The patient reported multiple episodes of belching and reflux during the first 24 hours.  She had three episodes of coughing.  Findings are compatible with significant acid reflux on day one.     Barbette Hair. Arlyce Dice, MD,FACG     RDK/MEDQ  D:  08/08/2010  T:  08/08/2010  Job:  [id number]  Electronically Signed by Melvia Heaps MDFACG on 08/23/2010 09:42:50 AM

## 2010-09-11 ENCOUNTER — Telehealth: Payer: Self-pay | Admitting: Gastroenterology

## 2010-09-11 ENCOUNTER — Ambulatory Visit: Payer: Self-pay | Admitting: Gastroenterology

## 2010-09-17 ENCOUNTER — Encounter: Payer: Self-pay | Admitting: Gastroenterology

## 2010-09-17 ENCOUNTER — Ambulatory Visit (INDEPENDENT_AMBULATORY_CARE_PROVIDER_SITE_OTHER): Payer: 59 | Admitting: Gastroenterology

## 2010-09-17 DIAGNOSIS — K219 Gastro-esophageal reflux disease without esophagitis: Secondary | ICD-10-CM

## 2010-09-17 NOTE — Progress Notes (Signed)
Summary: Endo results   Phone Note Call from Patient Call back at Home Phone [phone]   Caller: Patient Call For: Dr. Arlyce Dice Reason for Call: Lab or Test Results Summary of Call: Calling about ENDO results Initial call taken by: Karna Christmas,  September 11, 2010 11:22 AM  Follow-up for Phone Call        Pt went to [facility] for appointment. She r/s appointment because Dr Arlyce Dice has a hospital procedure today didnt know how long she would be. Tried to call pt back at [phone].Marland Kitchen No Answer... That was the number provided by her husband to call her back on.Marland KitchenMarland KitchenWill try back Follow-up by: Merri Ray CMA Duncan Dull),  September 11, 2010 11:38 AM  Additional Follow-up for Phone Call Additional follow up Details #1::        Dr Arlyce Dice, Pt wants Endo/Bravo results, She rescheduled her appointment. She went to the wrong office for appointment Additional Follow-up by: Merri Ray CMA Duncan Dull),  September 11, 2010 11:41 AM    Additional Follow-up for Phone Call Additional follow up Details #2::     On day one of her study she has significant gastroesophageal reflux.  I would like to discuss this further with her in the office. Follow-up by: Louis Meckel MD,  September 11, 2010 4:39 PM  Additional Follow-up for Phone Call Additional follow up Details #3:: Details for Additional Follow-up Action Taken: Contacted pt again, No Answer, L/M for pt to return call..   Dr Arlyce Dice, Pt is on schedule for Tuesday 09/17/2010 at 1:30 to see you in the office to discuss results OK-typed by Dr Arlyce Dice Additional Follow-up by: Merri Ray CMA Duncan Dull),  September 11, 2010 4:51 PM

## 2010-09-26 NOTE — Assessment & Plan Note (Signed)
Summary: follow up Bravo pH studt/robin    History of Present Illness Visit Type: Follow-up Visit Primary GI MD: Melvia Heaps MD [facility] Primary Provider: Landry Corporal Requesting Provider: n/a Chief Complaint: F/u from Bravo ph. Pt states that she is doing much better and denies any GI complaints  History of Present Illness:   [name] [name]  past return on upper endoscopy with Bravo pH study. Readings were only for day one  and were significant forgastroesophageal reflux.   She was taking protonix during this time.   Since her last visit, however, she's had no further episodes of pain. She remains on Protonix daily.    Chest wall pain subsided after taking a topical steroid.   GI Review of Systems      Denies abdominal pain, acid reflux, belching, bloating, chest pain, dysphagia with liquids, dysphagia with solids, heartburn, loss of appetite, nausea, vomiting, vomiting blood, weight loss, and  weight gain.        Denies anal fissure, black tarry stools, change in bowel habit, constipation, diarrhea, diverticulosis, fecal incontinence, heme positive stool, hemorrhoids, irritable bowel syndrome, jaundice, light color stool, liver problems, rectal bleeding, and  rectal pain.    Current Medications (verified): 1)  Paxil 10 Mg Tabs (Paroxetine Hcl) .Marland Kitchen.. 1 Tab By Mouth Once Daily 2)  Multivitamins  Tabs (Multiple Vitamin) .Marland Kitchen.. 1 By Mouth Once Daily 3)  Pantoprazole Sodium 40 Mg Tbec (Pantoprazole Sodium) .... Take 1 Tablet By Mouth Once A Day 4)  Topamax 100 Mg Tabs (Topiramate) .... Take 1 Tab By Mouth At Bedtime 5)  Loestrin 24 Fe 1-20 Mg-Mcg Tabs (Norethin Ace-Eth Estrad-Fe) .... Take 1 Tablet By Mouth Once A Day 6)  Valtrex 500 Mg Tabs (Valacyclovir Hcl) .... Take 1 Tablet By Mouth Once A Day 7)  Axert 12.5 Mg Tabs (Almotriptan Malate) .... As Needed For Migraine 8)  Nabumetone 500 Mg Tabs (Nabumetone) .... As Needed For Migraine 9)  Oxycodone-Acetaminophen 5-325 Mg Tabs  (Oxycodone-Acetaminophen) .... As Needed For Migraine 10)  Promethazine Hcl 25 Mg Tabs (Promethazine Hcl) .... As Needed For Nausea 11)  Xanax 0.25 Mg Tabs (Alprazolam) .... As Needed For Anxiety 12)  Fibercon 625 Mg Tabs (Calcium Polycarbophil) .... Take 1 Tab By Mouth At Bedtime 13)  Ferrous Sulfate 325 (65 Fe) Mg Tabs (Ferrous Sulfate) .... Take 1 Tab By Mouth At Bedtime 14)  Estroven  Tabs (Nutritional Supplements) .... Take 1 Tab By Mouth At Bedtime 15)  Desyrel Sleep Aid .... One Tablet By Mouth Once Daily  Allergies (verified): 1)  ! Ibuprofen  Past History:  Past Medical History: Reviewed history from 07/08/2010 and no changes required. PALPITATIONS CHEST PAIN UNSPECIFIED VERTIGO  PUD ABDOMINAL PAIN NAUSEA   Past Surgical History: Reviewed history from 07/08/2010 and no changes required. Appendectomy Tonsillectomy Rt elbow surgery Ruptured ovarian cyst April 1984, presumably with laparoscopy.  Numerous laparoscopies as mentioned above. Repair of fissures.  Right oophorectomy July 1997 by Dr. Ashley Royalty.  Family History: Reviewed history from 06/25/2010 and no changes required. No FH of Colon Cancer: Family History of Diabetes: Aunt Family History of Heart Disease: Mother   Social History: Reviewed history from 06/25/2010 and no changes required. 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  Alcohol Use - no Illicit Drug Use - no  Review of Systems  The patient denies allergy/sinus, anemia, anxiety-new, arthritis/joint pain, back pain, blood in urine, breast changes/lumps, change in vision, confusion, cough, coughing up blood, depression-new, fainting, fatigue, fever, headaches-new, hearing problems,  heart murmur, heart rhythm changes, itching, menstrual pain, muscle pains/cramps, night sweats, nosebleeds, pregnancy symptoms, shortness of breath, skin rash, sleeping problems, sore throat, swelling of feet/legs, swollen lymph glands, thirst -  excessive , urination - excessive , urination changes/pain, urine leakage, vision changes, and voice change.    Vital Signs:  Patient profile:   50 year old female Height:      63 inches Weight:      118 pounds BMI:     20.98 BSA:     1.55 Pulse rate:   72 / minute Pulse rhythm:   regular BP sitting:   118 / 74  (left arm) Cuff size:   regular  Vitals Entered By: Ok Anis CMA (September 17, 2010 1:43 PM)   Impression & Recommendations:  Problem # 1:  CHEST PAIN UNSPECIFIED (ICD-786.50) Assessment Improved  This apparently was due to chest wall pain.  Problem # 2:  ESOPHAGEAL REFLUX (ICD-530.81)  Although significant acid reflux was demonstrated by pH study , the patient remains asymptomatic.   Recommendations #1 continue Protonix  Problem # 3:  ESOPHAGEAL REFLUX (ICD-530.81)  Patient Instructions: 1)  Copy sent to : Landry Corporal 2)  Call back as needed  3)  The medication list was reviewed and reconciled.  All changed / newly prescribed medications were explained.  A complete medication list was provided to the patient / caregiver.

## 2010-12-03 NOTE — Assessment & Plan Note (Signed)
[facility]                         GASTROENTEROLOGY OFFICE NOTE   NAME:[name], [name] MATAYA                    MRN:          [id number]  DATE:11/30/2006                            DOB:          [birth year]    PROBLEM:  Erratic bowels.   Mrs. [name] [name] has returned for ongoing evaluation. On lactulose twice a  day, she is having loose stools. When she reduces to once a day, she has  had what felt like an incomplete evacuation and then this would be  followed about once a week with a day or two of profuse diarrhea. This  accompanied by urgency. This remains a cyclical pattern of what sounds  like mild constipation followed by compensatory diarrhea. Colonoscopy  was unrevealing. Mild erythema seen at colonoscopy did not bear out by  biopsies which were entirely normal.   Review of her medications revealed that she is taking Protonix twice a  day.   PHYSICAL EXAMINATION:  Pulse 80, blood pressure 112/72, weight 135.   IMPRESSION:  Irritable bowel syndrome with I believe underlying  constipation with overflow diarrhea.   RECOMMENDATIONS:  1. Reduce Protonix to 40 mg daily.  2. Continue fiber supplementation.  3. Lactulose 1 to 1.5 teaspoons daily. She was carefully instructed to      titrate the lactulose so that she has what feels like a complete      bowel movement in the hopes that this will prevent her overflow      diarrhea.     Barbette Hair. Arlyce Dice, MD,FACG  Electronically Signed    RDK/MedQ  DD: 11/30/2006  DT: 11/30/2006  Job #: [id number]   cc:   Lemmie Evens, M.D.  Altha Harm, MD

## 2010-12-06 NOTE — Assessment & Plan Note (Signed)
[facility]                         GASTROENTEROLOGY OFFICE NOTE   NAME:Gruver, EVANY SCHECTER                    MRN:          [id number]  DATE:11/02/2006                            DOB:          1961-05-03    PROBLEM:  Constipation and diarrhea.   Ms. Lundahl has returned for reevaluation.  On a regimen of Amitiza twice  a day she was having daily bowel movements with severe crampy pain.  Along with solid bowel movements, she was still having frequent  diarrhea.  She finally discontinued the Amitiza because of crampy pain.   PHYSICAL EXAMINATION:  VITAL SIGNS:  Pulse 80, blood pressure 100/70,  weight 138.   IMPRESSION:  Constipation and diarrhea.  I suspect this is all related  to irritable bowel syndrome, though a structural lesion of the colon  ought to be ruled out.   RECOMMENDATIONS:  1. Begin Miralax 17 g twice a day.  2. Colonoscopy.  Biopsies will be taken to rule out microscopic      colitis if there are no gross abnormalities.     Barbette Hair. Arlyce Dice, MD,FACG  Electronically Signed    RDK/MedQ  DD: 11/02/2006  DT: 11/03/2006  Job #: [id number]   cc:   Lemmie Evens, M.D.  James A. Ashley Royalty, M.D.

## 2010-12-06 NOTE — Op Note (Signed)
NAME:  [name] [name], [name] [name]                       ACCOUNT NO.:  [id number]   MEDICAL RECORD NO.:  [id number]                   PATIENT TYPE:  AMB   LOCATION:  SDC                                  FACILITY:  [facility]   PHYSICIAN:  James A. Ashley Royalty, M.D.             DATE OF BIRTH:  [birth year]   DATE OF PROCEDURE:  08/10/2002  DATE OF DISCHARGE:                                 OPERATIVE REPORT   PREOPERATIVE DIAGNOSES:  1. Pelvic pain, probably recurrent adhesions.  2. Fibroid uterus.   POSTOPERATIVE DIAGNOSES:  1. Omental adhesions to the left and right side wall at approximately the     level of the umbilicus.  2. Fibroid uterus.   PROCEDURE:  Diagnostic/operative laparoscopy with lysis of adhesions.   SURGEON:  Rudy Jew. Ashley Royalty, M.D.   ANESTHESIA:  General endotracheal.   ESTIMATED BLOOD LOSS:  25 cubic centimeters.   COMPLICATIONS:  None.   PACKS AND DRAINS:  None.   PROCEDURE:  The patient was taken to the operating room and placed in the  dorsal supine position.  After general anesthesia was administered she was  placed in the lithotomy position and prepped and draped in the usual manner  for abdominal and vaginal surgery.  Posterior weighted retractor was placed  per vagina.  The anterior lip of the cervix was grasped with a single tooth  tenaculum.  Jarcho uterine manipulator was placed per cervix and held in  place with the tenaculum.  Bladder was drained with a red rubber catheter.  Next, approximately 3 cubic centimeters of 0.25% bupivacaine was instilled  into the umbilicus.  A 1.2 cm infraumbilical incision was made in the  longitudinal plane.  The size 10-11 disposable laparoscopic trocar was  placed into the abdominal cavity.  The laparoscope was placed.  There was no  evidence of any trauma.  Pneumoperitoneum was created with CO2 and  maintained throughout the procedure.  Next, 5 mm suprapubic trocars were  placed in the left and right lower quadrants  respectively after placement of  a small amount of bupivacaine.  Transillumination and direct visualization  techniques were employed.  Pelvis was thoroughly surveyed.  The uterus was  normal size, shape, and contour save for a 2 cm posterior fundal subserosal  fibroid.  It was sessile.  The right adnexa was surgically absent.  The left  fallopian tube was normal size, shape, contour, and length.  The left ovary  was normal size, shape, and contour without evidence of any cysts or  endometriosis.  The anterior and posterior cul-de-sacs were clean.  The  remainder of the pelvis appeared perfectly normal.  Looking more cephalad  there were dense omental adhesions to the abdominal side wall at the level  of the umbilicus on both the left and right side.  Appropriate photos were  obtained.  Using the monopolar cautery the adhesions were lysed using sharp  and blunt dissection.  Hemostasis was maintained throughout.  Copious  irrigation was accomplished with the Nezhat suction irrigator.  No bleeding  vessels were encountered.  Appropriate photos were obtained before and  after.  Copious irrigation was accomplished.  Hemostasis was noted.   At this point the patient was felt to have benefitted maximally from the  surgical procedure.  The abdominal instruments were removed and  pneumoperitoneum evacuated.  Fascial defects were closed with 0 Vicryl in an  interrupted fashion.  The skin was closed with 2-0 chromic in a subcuticular  fashion.  The vaginal instruments were removed and the procedure terminated.  Approximately 10 cubic centimeters total of 0.25% bupivacaine was instilled  into the incisions to aid in postoperative analgesia.                                               James A. Ashley Royalty, M.D.    JAM/MEDQ  D:  08/10/2002  T:  08/10/2002  Job:  [id number]

## 2010-12-06 NOTE — Assessment & Plan Note (Signed)
[facility]                         GASTROENTEROLOGY OFFICE NOTE   NAME:[name] [name] KNOKE                    MRN:          [id number]  DATE:10/12/2006                            DOB:          22-Dec-1960    PROBLEM:  Abdominal pain and diarrhea.   HISTORY OF PRESENT ILLNESS:  [name] [name] has returned for reevaluation.  She is describing a cycle of 3-4 days of constipation followed by severe  crampy abdominal pain and diarrhea lasting 1-2 days.  She then reverts  back to constipation.  This has been occurring since September 2007.  She denies change in medications.  There is no history of melena or  hematochezia.   MEDICATIONS INCLUDE:  Iron, cyclobenzaprine, Valtrex, Topamax, Lyrica,  Loestrin, Paxil, Nexium, and dicyclomine.  She takes fiber supplements  daily.   ALLERGIES:  SHE IS ALLERGIC TO IBUPROFEN.   PHYSICAL EXAMINATION:  VITAL SIGNS:  Pulse 85, blood pressure 120/70,  weight 136.  HEENT:  EOMI.  PERRLA.  Sclerae are anicteric.  Conjunctivae are pink.  NECK:  Supple without thyromegaly, adenopathy or carotid bruits.  CHEST:  Clear to auscultation and percussion without adventitious  sounds.  CARDIAC:  Regular rhythm; normal S1 S2.  There are no murmurs, gallops  or rubs.  ABDOMEN:  There is minimal right upper quadrant tenderness without  guarding or rebound.  There are no abdominal masses or organomegaly.  EXTREMITIES:  Full range of motion.  No cyanosis, clubbing or edema.  RECTAL:  Deferred.   IMPRESSION:  Irritable bowel syndrome.  I suspect that she has  underlying constipation with compensatory diarrhea.  A structural lesion  in the colon is much less likely.   RECOMMENDATION:  1. Continue fiber supplementation.  2. Amitiza 24 mcg twice a day every 2-3 days if she has not had      spontaneous bowel movement.  3. Discontinue dicyclomine.  I will give her NuLev instead p.r.n.      abdominal pain.     Barbette Hair. Arlyce Dice,  MD,FACG  Electronically Signed    RDK/MedQ  DD: 10/12/2006  DT: 10/12/2006  Job #: [id number]   cc:   Lemmie Evens, M.D.  James A. Ashley Royalty, M.D.

## 2010-12-06 NOTE — H&P (Signed)
NAME:  [name] [name], [name] [name]                       ACCOUNT NO.:  [id number]   MEDICAL RECORD NO.:  [id number]                   PATIENT TYPE:  AMB   LOCATION:  SDC                                  FACILITY:  [facility]   PHYSICIAN:  James A. Ashley Royalty, M.D.             DATE OF BIRTH:  [birth year]   DATE OF ADMISSION:  08/10/2002  DATE OF DISCHARGE:                                HISTORY & PHYSICAL   HISTORY OF PRESENT ILLNESS:  The patient is a 50 year old gravida 1, para 0,  AB1 who presented to the office recently with recurrent lower abdominal  discomfort, left greater than right.  She states she cannot lie on her right  side or she cannot straighten out or lie on her stomach.  She states these  symptoms are sufficiently debilitating to warrant surgical intervention.  She is status post right salpingo-oophorectomy at laparoscopy in 1997.  She  had numerous laparoscopies in the past for pelvic pain and adhesions.   MEDICATIONS:  1. Atenolol.  2. Allegra.  3. Axert (migraines).  4. Ortho Novum 7/7/7.  5. Prevacid.  6. Sonata.   PAST MEDICAL HISTORY:  Peptic ulcer disease.   PAST SURGICAL HISTORY:  1. Ruptured ovarian cyst April 1984, presumably with laparoscopy.  Numerous     laparoscopies as mentioned above.  2. Appendectomy.  3. Repair of fissures.  4. Tonsillectomy.  5. Right oophorectomy July 1997 by Dr. Ashley Royalty.   ALLERGIES:  IBUPROFEN causes swelling.   FAMILY HISTORY:  Positive for hypertension and diabetes, myocardial  infarction and cerebrovascular accident.   SOCIAL HISTORY:  The patient denies the use of tobacco or alcohol.   REVIEW OF SYSTEMS:  Noncontributory.   PHYSICAL EXAMINATION:  GENERAL:  Well-developed, well-nourished pleasant  white female in no acute distress.  Fetal heart tones are stable.  SKIN:  Warm, dry and without lesions.  LYMPHATIC:  No cervical or inguinal adenopathy.  HEENT:  Normocephalic.  NECK:  Supple with no thyromegaly.  CHEST:  Lungs  are clear.  CARDIAC:  Regular rate and rhythm without murmurs, gallops or rubs.  BREASTS:  Soft without palpable masses, discharge, retractions.  ABDOMEN:  Soft and nontender without masses or organomegaly.  Bowel sounds  are active.  MUSCULOSKELETAL:  Full range of motion without edema, cyanosis or CVA  tenderness.  PELVIC:  Examination is deferred until examination under anesthesia.   IMPRESSION:  1. History of numerous cysts for which the patient has already had numerous     laparoscopies and right salpingo-oophorectomy.  2. Recurrent pelvic pain, etiology uncertain.  Differential includes     adhesions, endometriosis, primary GI, etc.  3. History of migraine headaches.  4. Peptic ulcer disease.   PLAN:  Diagnostic/operative laparoscopy.  Risks, benefits and alternatives  were fully discussed with the patient.  The possibility of exploratory  laparotomy discussed and accepted.  Questions invited and answered.  James A. Ashley Royalty, M.D.    JAM/MEDQ  D:  08/10/2002  T:  08/10/2002  Job:  [id number]

## 2010-12-06 NOTE — Op Note (Signed)
[facility]  Patient:    CHERL, GORNEY                 MRN: [id number] Proc. Date: 08/05/00 Adm. Date:  [id number] Disc. Date: [id number] Attending:  Wandalee Ferdinand                           Operative Report  PREOPERATIVE DIAGNOSIS:       Pelvic pain--probable recurrent adhesions.  POSTOPERATIVE DIAGNOSIS:      Pelvic pain--probable recurrent adhesions.  PROCEDURE:                    Diagnostic/operative laparoscopy.  SURGEON:                      Rudy Jew. Ashley Royalty, M.D.  ANESTHESIA:                   General.  ESTIMATED BLOOD LOSS:         Less than 25 cc.  COMPLICATIONS:                None.  PACKS AND DRAINS:             None.  DESCRIPTION OF PROCEDURE:     Patient was taken to the operating room and placed in a dorsal supine position. Right after adequate general anesthesia was administered, she was placed in the lithotomy position and prepped and draped in the usual manner for abdominal and vaginal surgery. Posterior weighted retractor was placed per vagina and the anterior lip of the cervix was grasped with a single-tooth tenaculum. HUMI uterine manipulator was placed per cervix and held in place with tenaculum. Bladder was drained with a red rubber catheter. A 1.5 cm infraumbilical incision was made in the longitudinal plane. Subcutaneous tissues and fascia were sharply dissected down to the peritoneal layer. The fascia were sharply and bluntly dissected DD:  08/06/00 TD:  08/07/00 Job: [id number]

## 2010-12-06 NOTE — H&P (Signed)
[facility]  Patient:    [name] [name], [name] [name]                 MRN: [id number] Adm. Date:  [id number] Attending:  Wandalee Ferdinand                         History and Physical  HISTORY OF PRESENT ILLNESS:   Patient is a 50 year old, gravida 1, para 0, AB 1, with a history of pelvic pain and numerous laparoscopies in the past.  She presented on July 31, 2000, stating "my adhesions are back."  She states she cramps approximately 80% of the time in her lower abdomen.  Most recently, she has had difficulty lying on her left side.  She is currently on Micronor.  MEDICATIONS:                  Atenolol, imipramine, Amerge (for migraines), Allegra (for allergies).  PAST MEDICAL HISTORY:         1. "Irregular heartbeat."                               2. Vertigo - etiology uncertain.  PAST SURGICAL HISTORY:        1. Numerous laparoscopies.                               2. Appendectomy.                               3. Removal of ovarian cyst.                               4. Repair of fissure.                               5. Tonsillectomy.  ALLERGIES:                    IBUPROFEN - causes swelling.  FAMILY HISTORY:               Positive for hypertension and diabetes.  SOCIAL HISTORY:               Patient denies use of tobacco ______ alcohol.  REVIEW OF SYSTEMS:            Noncontributory.  PHYSICAL EXAMINATION:  GENERAL:                      A well-developed, well-nourished, pleasant white female in no acute distress, afebrile.  VITAL SIGNS:                  Stable.  SKIN:                         Warm and dry without lesions.  LYMPH:                        There is no supraclavicular, cervical, or inguinal adenopathy.  HEENT:                        Normocephalic.  NECK:  Supple without thyromegaly.  CHEST AND LUNGS:              Clear.  CARDIAC:                      Regular rate and rhythm without  murmurs, gallops, or rubs.  BREASTS:                      Exam deferred.  ABDOMEN:                      Soft and nontender without masses or organomegaly.  Bowel sounds are active.  MUSCULOSKELETAL:              Examination reveals full range of motion without edema, cyanosis, or ______  PELVIC:                       Examination deferred until examination under anesthesia.  IMPRESSION:                   Pelvic pain - probably secondary to recurrent adhesions.  PLAN:                         Diagnostic/operative laparoscopy.  Risks, benefits, complications, and alternatives are fully discussed with the patient.  She states she understands and accepts.  Questions invited and answered. DD:  08/06/00 TD:  08/06/00 Job: [id number]

## 2010-12-06 NOTE — Op Note (Signed)
[facility]  Patient:    [name] [name], [name] [name]                      MRN: [id number] Proc. Date: 08/05/00 Attending:  Fayrene Fearing A. Ashley Royalty, M.D.                           Operative Report  PREOPERATIVE DIAGNOSIS:       Pelvic pain.  POSTOPERATIVE DIAGNOSIS:      Adhesions from the right colon to the right                               pelvic sidewall.  PROCEDURES:                   Diagnostic/operative laparoscopy with                               lysis of adhesions.  SURGEON:                      Rudy Jew. Ashley Royalty, M.D.  ANESTHESIA:                   General endotracheal.  ESTIMATED BLOOD LOSS:         Less than 50 cc.  COMPLICATIONS:                None.  PACKS AND DRAINS:             None.  DESCRIPTION OF PROCEDURE:     The patient was taken to the operating room and placed in a dorsal supine position and after adequate general anesthesia was administered, she was placed in the lithotomy position and prepped and draped in the usual manner for abdominal and vaginal surgery. A 1.5-cm infraumbilical incision was made in the longitudinal plane. An open laparoscopic technique was employed such that the subcutaneous tissues were sharply and bluntly dissected down to the fascia which was nicked with Mayo scissors. The operators finger was used to enter the abdominal cavity. An S retractor was placed in the abdominal cavity while stay sutures of 0 Vicryl were placed. The ______ laparoscopic trocar was then placed in the abdominal cavity. Its location was verified by placement of the laparoscope. There was no evidence of any trauma. The trocar was secured with the stay sutures. Pneumoperitoneum was created with CO2. The pelvis was then thoroughly inspected. Two suprapubic 5-mm trocars were placed in the left and right lower quadrants respectively using transillumination and direct visualization techniques. The uterus was normal size, shape, and contour  without any evidence of any fibroids or endometriosis. The left fallopian tube was normal size, shape, contour, and length with luxuriant fimbria. The left ovary was normal size, shape, and contour without any evidence of any cysts or endometriosis. The right adnexa was surgically absent. Immediately upon surveying the pelvis, a large segment of the right colon was noted to be adherent to the right pelvic sidewall with dense adhesions that were clearly possibly responsible for the patients lower abdominal pain. These continued well up the right pelvic sidewall into the abdomen and extended somewhat above the umbilicus as well. There were also a few filmy adhesions on the left upper quadrant from the omentum to the anterior abdominal wall. However, the patient was asymptomatic in  that area. Appropriate photos were obtained. The remainder of the peritoneal surfaces were smooth and glistening. At this point, attention was turned to adhesiolysis. Using the YAG Laser with a GRP6 tip and 14 watts power, avascular planes were found to lyse the right colon from the anterior abdominal wall. There was no evidence at any time of any threat to the integrity of the colon at all. Hemostasis was noted. Appropriate photos were obtained after as well. The patient tolerated the procedure extremely well. The abdominal instruments were removed and pneumoperitoneum evacuated. Fascial defects were closed with 0 Vicryl in an interrupted fashion. The skin was closed with 3-0 chromic in subcuticular fashion. Vaginal instruments were removed. Again, hemostasis was noted. The procedure was then terminated. The patient was taken to the recovery room in excellent condition. DD:  08/05/00 TD:  08/05/00 Job: [id number]

## 2010-12-06 NOTE — Op Note (Signed)
[facility]  Patient:    [name] [name], [name] [name]                 MRN: [id number] Proc. Date: 10/04/99 Adm. Date:  [id number] Disc. Date: [id number] Attending:  Henrene Dodge CC:         Anselm Pancoast. Zachery Dakins, M.D.                           Operative Report  PREOPERATIVE DIAGNOSIS:  Recurrent chronic posterior anal fissure.  POSTOPERATIVE DIAGNOSIS:  Recurrent chronic posterior anal fissure.  OPERATION:  Internal sphincterotomy.  SURGEON:  Anselm Pancoast. Zachery Dakins, M.D.  HISTORY:  The patient is a 50 year old Caucasian female referred to me by Dr. Jonita Albee for management of chronic recurring anal pain that appeared secondary to a fissure.  She said for approximately eight months, she has had pain, kind of an acute fissure, posterior, but appeared to heal.  She has never had much hemorrhoids.  She has had a colonoscopy with flexible sigmoidoscopy and this would get better.  She would nearly be asymptomatic, only to have recurring problems. I had seen her in the office really in November.  She was hoping that she was improving.  I saw her about a month later.  Again, she was having episodes of improvement and then recurring symptoms and she has finally called and she would like to proceed with the internal sphincterotomy.  No additional studies were needed.  DESCRIPTION OF PROCEDURE:  Patient was taken to the operating suite for induction of general anesthesia and with the anus relaxed, you could see the chronic fissure and there were actually two, one kind of posterior and one to the right with the underlying sphincter noted posteriorly, and of course after the relaxation with the anesthesia, her anal is not tight like she is in the office.  You could feel the little internal area between the internal and external and I made a small opening over the left lateral using a hemostat to bluntly dissect out the internal sphincter,  elevate it over the hemostat and then divided it with cautery.  I then basically put one 3-0 chromic suture to close the little incision.  We had her o be awakened or nearly awakened and we could feel where the sphincter obviously s divided.  At that time, I anesthetized the perianal area after reprepping it with Betadine with Marcaine with Adrenalin for immediate postoperative pain and the surgery completed.  The patient tolerated the procedure nicely and will be released after a short stay in the recovery room.DD:  10/04/99 TD:  10/07/99 Job: [id number]

## 2011-03-25 ENCOUNTER — Other Ambulatory Visit (HOSPITAL_COMMUNITY): Payer: Self-pay | Admitting: Obstetrics and Gynecology

## 2011-03-25 DIAGNOSIS — Z1231 Encounter for screening mammogram for malignant neoplasm of breast: Secondary | ICD-10-CM

## 2011-04-29 ENCOUNTER — Ambulatory Visit (HOSPITAL_COMMUNITY)
Admission: RE | Admit: 2011-04-29 | Discharge: 2011-04-29 | Disposition: A | Discharge status: Home / Self Care | Payer: 59 | Source: Ambulatory Visit | Attending: Obstetrics and Gynecology | Admitting: Obstetrics and Gynecology

## 2011-04-29 DIAGNOSIS — Z1231 Encounter for screening mammogram for malignant neoplasm of breast: Secondary | ICD-10-CM | POA: Insufficient documentation

## 2011-07-23 ENCOUNTER — Encounter (HOSPITAL_COMMUNITY): Payer: Self-pay

## 2011-07-25 ENCOUNTER — Other Ambulatory Visit: Payer: Self-pay | Admitting: Obstetrics and Gynecology

## 2011-07-31 ENCOUNTER — Encounter (HOSPITAL_COMMUNITY): Payer: Self-pay

## 2011-07-31 ENCOUNTER — Encounter (HOSPITAL_COMMUNITY)
Admission: RE | Admit: 2011-07-31 | Discharge: 2011-07-31 | Disposition: A | Discharge status: Home / Self Care | Payer: 59 | Source: Ambulatory Visit | Attending: Obstetrics and Gynecology | Admitting: Obstetrics and Gynecology

## 2011-07-31 HISTORY — DX: Headache: R51

## 2011-07-31 HISTORY — DX: Gastro-esophageal reflux disease without esophagitis: K21.9

## 2011-07-31 HISTORY — DX: Insomnia, unspecified: G47.00

## 2011-07-31 HISTORY — DX: Anxiety disorder, unspecified: F41.9

## 2011-07-31 HISTORY — DX: Other specified postprocedural states: Z98.890

## 2011-07-31 HISTORY — DX: Other specified postprocedural states: R11.2

## 2011-07-31 HISTORY — DX: Migraine, unspecified, not intractable, without status migrainosus: G43.909

## 2011-07-31 LAB — CBC
Hemoglobin: 13.6 g/dL (ref 12.0–15.0)
MCH: 33.3 pg (ref 26.0–34.0)
Platelets: 277 10*3/uL (ref 150–400)
RBC: 4.08 MIL/uL (ref 3.87–5.11)

## 2011-07-31 NOTE — Patient Instructions (Signed)
   Your procedure is scheduled on: Tuesday Jan. 15 at 7:30am  Enter through the Main Entrance of [facility] at: 6:00am Pick up the phone at the desk and dial [phone] and inform us of your arrival.  Please call this number if you have any problems the morning of surgery: [phone]  Remember: Do not eat food after midnight: Sunday Do not drink clear liquids after: Monday Take these medicines the morning of surgery with a SIP OF WATER:  Protonix  Do not wear jewelry, make-up, or FINGER nail polish Do not wear lotions, powders, perfumes or deodorant. Do not shave 48 hours prior to surgery. Do not bring valuables to the hospital.  Leave suitcase in the car. After Surgery it may be brought to your room. For patients being admitted to the hospital, checkout time is 11:00am the day of discharge.  Remember to use your hibiclens as instructed.Please shower with 1/2 bottle the evening before your surgery and the other 1/2 bottle the morning of surgery.

## 2011-08-04 NOTE — H&P (Signed)
[name] [name] is an 51 y.o. female G0P0 with history of endometriosis and pelvic adhesions has increasing irregular heavy menses refractory to OCPs.  U/S C/W multiple fibroids 4.2 cm to 6 mm.  Also c/o increasing pelvic pain. S/P RSO.  Has intermittent vasomotor symptoms becoming more frequent.  Pertinent Gynecological History: Menses: flow is excessive with use of 6 pads or tampons on heaviest days and with severe dysmenorrhea Bleeding: intermenstrual bleeding Contraception: none DES exposure: unknown Blood transfusions: none Sexually transmitted diseases: no past history Previous GYN Procedures: multiple L/S for lysis of adhesions and treatment of endometriosis  Last mammogram: normal Date: 10/12 Last pap: normal Date: 11/12 OB History: G0, P0   Menstrual History: Menarche age: unknown No LMP recorded.    Past Medical History  Diagnosis Date  . Headache   . Migraines   . Insomnia   . Anxiety   . GERD (gastroesophageal reflux disease)   . PONV (postoperative nausea and vomiting)     Past Surgical History  Procedure Date  . Appendectomy 1987  . Diagnostic laparoscopy 1987    Ovarian Cysts  . Tonsillectomy   . Elbow arthroscopy     No family history on file.  Social History:  reports that she has never smoked. She does not have any smokeless tobacco history on file. She reports that she does not drink alcohol or use illicit drugs.  Allergies:  Allergies  Allergen Reactions  . Ibuprofen     REACTION: facial and throat swelling  . Nsaids Other (See Comments)    Most cause swelling, itching, and hives - ibuprofen worst offender    No prescriptions prior to admission    Review of Systems  Gastrointestinal: Positive for diarrhea and constipation.       Alternating diarrhea and constipation, S/P GI evaluation  Genitourinary: Positive for urgency.       Urgency and occasional urge incontinence Nocturia  Musculoskeletal: Positive for back pain and joint pain.       Hx of back pain, uses TENS unit on occasion   Neurological: Positive for headaches.    There were no vitals taken for this visit. Physical Exam  Constitutional: She appears well-developed and well-nourished.  Cardiovascular: Normal rate and regular rhythm.   Respiratory: Effort normal and breath sounds normal.  GI: Soft. Bowel sounds are normal.  Genitourinary:       Uterus 8 weeks, RV, mildly tender, mobile Adnexa without masses S/P RSO    No results found for this or any previous visit (from the past 24 hour(s)).  No results found.  Assessment/Plan: 51 yo G0P0 with endometriosis and fibroids with increasingly heavy and irregular menses and pelvic pain and vasomotor symptoms. D/W patient options-admitted for LAVH and removal of remaining ovary.  Reviewed risks/benefits, issues of menopause and treatment of symptoms, possible laparotomy.  [name] [name] II,Pat Elicker E 08/04/2011, 6:24 PM

## 2011-08-05 ENCOUNTER — Encounter (HOSPITAL_COMMUNITY): Payer: Self-pay | Admitting: Anesthesiology

## 2011-08-05 ENCOUNTER — Ambulatory Visit (HOSPITAL_COMMUNITY): Payer: 59 | Admitting: Anesthesiology

## 2011-08-05 ENCOUNTER — Other Ambulatory Visit: Payer: Self-pay | Admitting: Obstetrics and Gynecology

## 2011-08-05 ENCOUNTER — Encounter (HOSPITAL_COMMUNITY)
Admission: RE | Disposition: A | Discharge status: Home / Self Care | Payer: Self-pay | Source: Ambulatory Visit | Attending: Obstetrics and Gynecology

## 2011-08-05 ENCOUNTER — Ambulatory Visit (HOSPITAL_COMMUNITY)
Admission: RE | Admit: 2011-08-05 | Discharge: 2011-08-06 | Disposition: A | Discharge status: Home / Self Care | Payer: 59 | Source: Ambulatory Visit | Attending: Obstetrics and Gynecology | Admitting: Obstetrics and Gynecology

## 2011-08-05 ENCOUNTER — Encounter (HOSPITAL_COMMUNITY): Payer: Self-pay | Admitting: *Deleted

## 2011-08-05 DIAGNOSIS — N803 Endometriosis of pelvic peritoneum, unspecified: Secondary | ICD-10-CM | POA: Insufficient documentation

## 2011-08-05 DIAGNOSIS — D219 Benign neoplasm of connective and other soft tissue, unspecified: Secondary | ICD-10-CM | POA: Diagnosis present

## 2011-08-05 DIAGNOSIS — D251 Intramural leiomyoma of uterus: Secondary | ICD-10-CM | POA: Insufficient documentation

## 2011-08-05 DIAGNOSIS — Z01812 Encounter for preprocedural laboratory examination: Secondary | ICD-10-CM | POA: Insufficient documentation

## 2011-08-05 DIAGNOSIS — N809 Endometriosis, unspecified: Secondary | ICD-10-CM | POA: Diagnosis present

## 2011-08-05 DIAGNOSIS — N920 Excessive and frequent menstruation with regular cycle: Secondary | ICD-10-CM | POA: Insufficient documentation

## 2011-08-05 DIAGNOSIS — N946 Dysmenorrhea, unspecified: Secondary | ICD-10-CM | POA: Insufficient documentation

## 2011-08-05 DIAGNOSIS — D252 Subserosal leiomyoma of uterus: Secondary | ICD-10-CM | POA: Insufficient documentation

## 2011-08-05 DIAGNOSIS — Z01818 Encounter for other preprocedural examination: Secondary | ICD-10-CM | POA: Insufficient documentation

## 2011-08-05 HISTORY — PX: LAPAROSCOPIC ASSISTED VAGINAL HYSTERECTOMY: SHX5398

## 2011-08-05 HISTORY — PX: SALPINGOOPHORECTOMY: SHX82

## 2011-08-05 SURGERY — HYSTERECTOMY, VAGINAL, LAPAROSCOPY-ASSISTED
Anesthesia: General | Site: Abdomen | Laterality: Left | Wound class: Clean Contaminated

## 2011-08-05 MED ORDER — METOCLOPRAMIDE HCL 5 MG/ML IJ SOLN: 10.0000 mg | Freq: Once | INTRAMUSCULAR | Status: DC | PRN

## 2011-08-05 MED ORDER — ONDANSETRON HCL 4 MG/2ML IJ SOLN
INTRAMUSCULAR | Status: AC
  Filled 2011-08-05: qty 2

## 2011-08-05 MED ORDER — MIDAZOLAM HCL 5 MG/5ML IJ SOLN
INTRAMUSCULAR | Status: DC | PRN
  Administered 2011-08-05: 2 mg via INTRAVENOUS

## 2011-08-05 MED ORDER — DEXAMETHASONE SODIUM PHOSPHATE 10 MG/ML IJ SOLN
INTRAMUSCULAR | Status: AC
  Filled 2011-08-05: qty 1

## 2011-08-05 MED ORDER — LIDOCAINE HCL (CARDIAC) 20 MG/ML IV SOLN
INTRAVENOUS | Status: DC | PRN
  Administered 2011-08-05: 60 mg via INTRAVENOUS

## 2011-08-05 MED ORDER — ACETAMINOPHEN 10 MG/ML IV SOLN
INTRAVENOUS | Status: DC | PRN
  Administered 2011-08-05: 1000 mg via INTRAVENOUS

## 2011-08-05 MED ORDER — NALOXONE HCL 0.4 MG/ML IJ SOLN: 0.4000 mg | INTRAMUSCULAR | Status: DC | PRN

## 2011-08-05 MED ORDER — BUPIVACAINE HCL (PF) 0.5 % IJ SOLN
INTRAMUSCULAR | Status: DC | PRN
  Administered 2011-08-05: 30 mL

## 2011-08-05 MED ORDER — KETOROLAC TROMETHAMINE 30 MG/ML IJ SOLN: 15.0000 mg | Freq: Once | INTRAMUSCULAR | Status: DC | PRN

## 2011-08-05 MED ORDER — SUMATRIPTAN SUCCINATE 6 MG/0.5ML ~~LOC~~ SOLN: 6.0000 mg | Freq: Every day | SUBCUTANEOUS | Status: DC | PRN

## 2011-08-05 MED ORDER — FENTANYL CITRATE 0.05 MG/ML IJ SOLN: 25.0000 ug | INTRAMUSCULAR | Status: DC | PRN

## 2011-08-05 MED ORDER — SCOPOLAMINE 1 MG/3DAYS TD PT72
1.0000 | MEDICATED_PATCH | Freq: Once | TRANSDERMAL | Status: DC
  Administered 2011-08-05: 1.5 mg via TRANSDERMAL

## 2011-08-05 MED ORDER — PROPOFOL 10 MG/ML IV EMUL
INTRAVENOUS | Status: AC
  Filled 2011-08-05: qty 20

## 2011-08-05 MED ORDER — DIPHENHYDRAMINE HCL 50 MG/ML IJ SOLN
INTRAMUSCULAR | Status: DC | PRN
  Administered 2011-08-05: 12.5 mg via INTRAVENOUS

## 2011-08-05 MED ORDER — ONDANSETRON HCL 4 MG/2ML IJ SOLN: 4.0000 mg | Freq: Four times a day (QID) | INTRAMUSCULAR | Status: DC | PRN

## 2011-08-05 MED ORDER — MIDAZOLAM HCL 2 MG/2ML IJ SOLN
INTRAMUSCULAR | Status: AC
  Filled 2011-08-05: qty 2

## 2011-08-05 MED ORDER — ACETAMINOPHEN 325 MG PO TABS: 325.0000 mg | ORAL_TABLET | ORAL | Status: DC | PRN

## 2011-08-05 MED ORDER — LACTATED RINGERS IV SOLN
INTRAVENOUS | Status: DC
  Administered 2011-08-05 (×2): via INTRAVENOUS

## 2011-08-05 MED ORDER — METOCLOPRAMIDE HCL 5 MG/ML IJ SOLN
INTRAMUSCULAR | Status: AC
  Filled 2011-08-05: qty 2

## 2011-08-05 MED ORDER — LIDOCAINE HCL (CARDIAC) 20 MG/ML IV SOLN
INTRAVENOUS | Status: AC
  Filled 2011-08-05: qty 5

## 2011-08-05 MED ORDER — TOPIRAMATE 100 MG PO TABS
100.0000 mg | ORAL_TABLET | Freq: Every day | ORAL | Status: DC
  Administered 2011-08-05: 100 mg via ORAL
  Filled 2011-08-05 (×2): qty 1

## 2011-08-05 MED ORDER — FENTANYL CITRATE 0.05 MG/ML IJ SOLN
INTRAMUSCULAR | Status: DC | PRN
  Administered 2011-08-05: 25 ug via INTRAVENOUS
  Administered 2011-08-05: 50 ug via INTRAVENOUS
  Administered 2011-08-05 (×3): 25 ug via INTRAVENOUS

## 2011-08-05 MED ORDER — FENTANYL CITRATE 0.05 MG/ML IJ SOLN
INTRAMUSCULAR | Status: AC
  Filled 2011-08-05: qty 5

## 2011-08-05 MED ORDER — HYDROMORPHONE 0.3 MG/ML IV SOLN
INTRAVENOUS | Status: AC
  Filled 2011-08-05: qty 25

## 2011-08-05 MED ORDER — ALUM & MAG HYDROXIDE-SIMETH 200-200-20 MG/5ML PO SUSP: 30.0000 mL | ORAL | Status: DC | PRN

## 2011-08-05 MED ORDER — ROCURONIUM BROMIDE 100 MG/10ML IV SOLN
INTRAVENOUS | Status: DC | PRN
  Administered 2011-08-05: 35 mg via INTRAVENOUS

## 2011-08-05 MED ORDER — OXYCODONE-ACETAMINOPHEN 5-325 MG PO TABS
1.0000 | ORAL_TABLET | ORAL | Status: DC | PRN
  Administered 2011-08-06: 1 via ORAL
  Administered 2011-08-06: 2 via ORAL
  Filled 2011-08-05: qty 2
  Filled 2011-08-05: qty 1

## 2011-08-05 MED ORDER — FENTANYL CITRATE 0.05 MG/ML IJ SOLN
25.0000 ug | Freq: Once | INTRAMUSCULAR | Status: AC
  Administered 2011-08-05: 25 ug via INTRAVENOUS

## 2011-08-05 MED ORDER — HYDROMORPHONE 0.3 MG/ML IV SOLN
INTRAVENOUS | Status: DC
  Administered 2011-08-05: 2.19 mg via INTRAVENOUS
  Administered 2011-08-05: 11:00:00 via INTRAVENOUS
  Administered 2011-08-06: 0.799 mg via INTRAVENOUS
  Administered 2011-08-06: 1.19 mg via INTRAVENOUS

## 2011-08-05 MED ORDER — DEXAMETHASONE SODIUM PHOSPHATE 10 MG/ML IJ SOLN
INTRAMUSCULAR | Status: DC | PRN
  Administered 2011-08-05: 10 mg via INTRAVENOUS

## 2011-08-05 MED ORDER — SODIUM CHLORIDE 0.9 % IJ SOLN: 9.0000 mL | INTRAMUSCULAR | Status: DC | PRN

## 2011-08-05 MED ORDER — ROCURONIUM BROMIDE 50 MG/5ML IV SOLN
INTRAVENOUS | Status: AC
  Filled 2011-08-05: qty 1

## 2011-08-05 MED ORDER — PROMETHAZINE HCL 25 MG/ML IJ SOLN: 6.2500 mg | INTRAMUSCULAR | Status: DC | PRN

## 2011-08-05 MED ORDER — MEPERIDINE HCL 25 MG/ML IJ SOLN: 6.2500 mg | INTRAMUSCULAR | Status: DC | PRN

## 2011-08-05 MED ORDER — SODIUM CHLORIDE 0.9 % IJ SOLN
INTRAMUSCULAR | Status: DC | PRN
  Administered 2011-08-05: 10 mL

## 2011-08-05 MED ORDER — DIPHENHYDRAMINE HCL 50 MG/ML IJ SOLN: 12.5000 mg | Freq: Four times a day (QID) | INTRAMUSCULAR | Status: DC | PRN

## 2011-08-05 MED ORDER — FENTANYL CITRATE 0.05 MG/ML IJ SOLN
INTRAMUSCULAR | Status: AC
  Administered 2011-08-05: 25 ug via INTRAVENOUS
  Filled 2011-08-05: qty 2

## 2011-08-05 MED ORDER — PROPOFOL 10 MG/ML IV EMUL
INTRAVENOUS | Status: DC | PRN
  Administered 2011-08-05: 150 mg via INTRAVENOUS

## 2011-08-05 MED ORDER — LACTATED RINGERS IV SOLN
INTRAVENOUS | Status: DC
  Administered 2011-08-05 (×3): via INTRAVENOUS

## 2011-08-05 MED ORDER — ONDANSETRON HCL 4 MG/2ML IJ SOLN
INTRAMUSCULAR | Status: DC | PRN
  Administered 2011-08-05: 4 mg via INTRAVENOUS

## 2011-08-05 MED ORDER — ZOLPIDEM TARTRATE 5 MG PO TABS: 5.0000 mg | ORAL_TABLET | Freq: Every evening | ORAL | Status: DC | PRN

## 2011-08-05 MED ORDER — ONDANSETRON HCL 4 MG PO TABS: 4.0000 mg | ORAL_TABLET | Freq: Four times a day (QID) | ORAL | Status: DC | PRN

## 2011-08-05 MED ORDER — ACETAMINOPHEN 325 MG PO TABS: 650.0000 mg | ORAL_TABLET | ORAL | Status: DC | PRN

## 2011-08-05 MED ORDER — CEFAZOLIN SODIUM 1-5 GM-% IV SOLN
1.0000 g | INTRAVENOUS | Status: AC
  Administered 2011-08-05: 1 g via INTRAVENOUS

## 2011-08-05 MED ORDER — DIPHENHYDRAMINE HCL 50 MG/ML IJ SOLN
INTRAMUSCULAR | Status: AC
  Filled 2011-08-05: qty 1

## 2011-08-05 MED ORDER — PANTOPRAZOLE SODIUM 40 MG PO TBEC
40.0000 mg | DELAYED_RELEASE_TABLET | Freq: Every day | ORAL | Status: DC
  Administered 2011-08-05: 40 mg via ORAL
  Filled 2011-08-05 (×2): qty 1

## 2011-08-05 MED ORDER — LACTATED RINGERS IR SOLN
Status: DC | PRN
  Administered 2011-08-05: 3000 mL

## 2011-08-05 MED ORDER — ACETAMINOPHEN 10 MG/ML IV SOLN
1000.0000 mg | Freq: Once | INTRAVENOUS | Status: DC
  Filled 2011-08-05: qty 100

## 2011-08-05 MED ORDER — SCOPOLAMINE 1 MG/3DAYS TD PT72
MEDICATED_PATCH | TRANSDERMAL | Status: AC
  Filled 2011-08-05: qty 1

## 2011-08-05 MED ORDER — DROPERIDOL 2.5 MG/ML IJ SOLN
INTRAMUSCULAR | Status: AC
  Filled 2011-08-05: qty 2

## 2011-08-05 MED ORDER — PAROXETINE HCL 20 MG PO TABS
20.0000 mg | ORAL_TABLET | Freq: Every day | ORAL | Status: DC
  Administered 2011-08-05: 20 mg via ORAL
  Filled 2011-08-05 (×2): qty 1

## 2011-08-05 MED ORDER — DROPERIDOL 2.5 MG/ML IJ SOLN
INTRAMUSCULAR | Status: DC | PRN
  Administered 2011-08-05: 0.625 mg via INTRAVENOUS

## 2011-08-05 MED ORDER — DOCUSATE SODIUM 100 MG PO CAPS
100.0000 mg | ORAL_CAPSULE | Freq: Two times a day (BID) | ORAL | Status: DC
  Administered 2011-08-05 – 2011-08-06 (×2): 100 mg via ORAL
  Filled 2011-08-05 (×2): qty 1

## 2011-08-05 MED ORDER — DIPHENHYDRAMINE HCL 12.5 MG/5ML PO ELIX: 12.5000 mg | ORAL_SOLUTION | Freq: Four times a day (QID) | ORAL | Status: DC | PRN

## 2011-08-05 MED ORDER — METOCLOPRAMIDE HCL 5 MG/ML IJ SOLN
INTRAMUSCULAR | Status: DC | PRN
  Administered 2011-08-05: 10 mg via INTRAVENOUS

## 2011-08-05 SURGICAL SUPPLY — 42 items
CABLE HIGH FREQUENCY MONO STRZ (ELECTRODE) IMPLANT
CATH ROBINSON RED A/P 16FR (CATHETERS) ×3 IMPLANT
CATH SILICONE 16FRX5CC (CATHETERS) ×3 IMPLANT
CLOTH BEACON ORANGE TIMEOUT ST (SAFETY) ×3 IMPLANT
CONT PATH 16OZ SNAP LID 3702 (MISCELLANEOUS) ×3 IMPLANT
COVER TABLE BACK 60X90 (DRAPES) ×3 IMPLANT
DECANTER SPIKE VIAL GLASS SM (MISCELLANEOUS) ×3 IMPLANT
DERMABOND ADVANCED (GAUZE/BANDAGES/DRESSINGS) ×2
DERMABOND ADVANCED .7 DNX12 (GAUZE/BANDAGES/DRESSINGS) ×4 IMPLANT
ELECT LIGASURE LONG (ELECTRODE) ×3 IMPLANT
ELECT LIGASURE SHORT 9 REUSE (ELECTRODE) IMPLANT
ELECT REM PT RETURN 9FT ADLT (ELECTROSURGICAL)
ELECTRODE REM PT RTRN 9FT ADLT (ELECTROSURGICAL) IMPLANT
GAUZE SPONGE 4X4 16PLY XRAY LF (GAUZE/BANDAGES/DRESSINGS) ×3 IMPLANT
GLOVE BIO SURGEON STRL SZ8 (GLOVE) ×9 IMPLANT
GLOVE BIOGEL PI IND STRL 6.5 (GLOVE) ×2 IMPLANT
GLOVE BIOGEL PI INDICATOR 6.5 (GLOVE) ×1
GOWN PREVENTION PLUS LG XLONG (DISPOSABLE) ×9 IMPLANT
NEEDLE INSUFFLATION 14GA 120MM (NEEDLE) ×3 IMPLANT
NS IRRIG 1000ML POUR BTL (IV SOLUTION) ×3 IMPLANT
PACK LAVH (CUSTOM PROCEDURE TRAY) ×3 IMPLANT
SCISSORS LAP 5X45 EPIX DISP (ENDOMECHANICALS) IMPLANT
SEALER TISSUE G2 CVD JAW 45CM (ENDOMECHANICALS) ×3 IMPLANT
SEALER TISSUE X1 CVD JAW (INSTRUMENTS) IMPLANT
SET IRRIG TUBING LAPAROSCOPIC (IRRIGATION / IRRIGATOR) IMPLANT
STRIP CLOSURE SKIN 1/4X3 (GAUZE/BANDAGES/DRESSINGS) ×3 IMPLANT
SUT MNCRL 0 MO-4 VIOLET 18 CR (SUTURE) ×4 IMPLANT
SUT MNCRL 0 VIOLET 6X18 (SUTURE) ×2 IMPLANT
SUT MNCRL AB 0 CT1 27 (SUTURE) ×6 IMPLANT
SUT MONOCRYL 0 6X18 (SUTURE) ×1
SUT MONOCRYL 0 MO 4 18  CR/8 (SUTURE) ×2
SUT VIC AB 3-0 PS2 18 (SUTURE) ×1
SUT VIC AB 3-0 PS2 18XBRD (SUTURE) ×2 IMPLANT
SUT VICRYL 0 UR6 27IN ABS (SUTURE) ×3 IMPLANT
SYR 30ML LL (SYRINGE) ×3 IMPLANT
SYR 5ML LL (SYRINGE) ×3 IMPLANT
TOWEL OR 17X24 6PK STRL BLUE (TOWEL DISPOSABLE) ×6 IMPLANT
TRAY FOLEY CATH 14FR (SET/KITS/TRAYS/PACK) ×3 IMPLANT
TROCAR XCEL NON-BLD 11X100MML (ENDOMECHANICALS) ×3 IMPLANT
TROCAR XCEL NON-BLD 5MMX100MML (ENDOMECHANICALS) ×3 IMPLANT
WARMER LAPAROSCOPE (MISCELLANEOUS) ×3 IMPLANT
WATER STERILE IRR 1000ML POUR (IV SOLUTION) ×3 IMPLANT

## 2011-08-05 NOTE — Addendum Note (Signed)
Addendum  created 08/05/11 1406 by Quin Hoop Stanislav Gervase   Modules edited:Notes Section

## 2011-08-05 NOTE — Brief Op Note (Signed)
08/05/2011  8:48 AM  PATIENT:  [name] [name]  51 y.o. female  PRE-OPERATIVE DIAGNOSIS:  fibroids  POST-OPERATIVE DIAGNOSIS:  Fibroids  PROCEDURE:  Procedure(s): LAPAROSCOPIC ASSISTED VAGINAL HYSTERECTOMY D LEFT SALPINGO OOPHERECTOMY  SURGEON:  Surgeon(s): Liston Alba, MD  PHYSICIAN ASSISTANT:   ASSISTANTS: Varney Baas md  ANESTHESIA:   general  EBL:  Total I/O In: 1000 [I.V.:1000] Out: 250 [Urine:100; Blood:150]  BLOOD ADMINISTERED:none  DRAINS: Urinary Catheter (Foley)   LOCAL MEDICATIONS USED:  MARCAINE 32CC  SPECIMEN:  Source of Specimen:  uterus, left tube/ovary  DISPOSITION OF SPECIMEN:  PATHOLOGY  COUNTS:  YES  TOURNIQUET:  * No tourniquets in log *  DICTATION: .Other Dictation: Dictation Number  [id number]  PLAN OF CARE: Admit to inpatient   PATIENT DISPOSITION:  PACU - hemodynamically stable.   Delay start of Pharmacological VTE agent (>24hrs) due to surgical blood loss or risk of bleeding:  {YES/NO/NOT APPLICABLE:20182

## 2011-08-05 NOTE — Transfer of Care (Signed)
Immediate Anesthesia Transfer of Care Note  Patient: [name] [name]  Procedure(s) Performed:  LAPAROSCOPIC ASSISTED VAGINAL HYSTERECTOMY; SALPINGO OOPHERECTOMY  Patient Location: PACU  Anesthesia Type: General  Level of Consciousness: awake and sedated  Airway & Oxygen Therapy: Patient Spontanous Breathing and Patient connected to nasal cannula oxygen  Post-op Assessment: Report given to PACU RN and Post -op Vital signs reviewed and stable  Post vital signs: Reviewed and stable Filed Vitals:   08/05/11 0903  BP: 134/69  Pulse: 96  Temp: 36.5 C  Resp: 12    Complications: No apparent anesthesia complications

## 2011-08-05 NOTE — Anesthesia Preprocedure Evaluation (Signed)
Anesthesia Evaluation  Patient identified by MRN, date of birth, ID band Patient awake    Reviewed: Allergy & Precautions, H&P , Patient's Chart, lab work & pertinent test results, reviewed documented beta blocker date and time   History of Anesthesia Complications (+) PONV  Airway Mallampati: II TM Distance: >3 FB Neck ROM: full    Dental No notable dental hx.    Pulmonary neg pulmonary ROS,  clear to auscultation  Pulmonary exam normal       Cardiovascular Exercise Tolerance: Good neg cardio ROS regular Normal    Neuro/Psych Negative Neurological ROS  Negative Psych ROS   GI/Hepatic negative GI ROS, Neg liver ROS, PUD, GERD-  ,  Endo/Other  Negative Endocrine ROS  Renal/GU negative Renal ROS     Musculoskeletal   Abdominal   Peds  Hematology negative hematology ROS (+)   Anesthesia Other Findings   Reproductive/Obstetrics negative OB ROS                           Anesthesia Physical Anesthesia Plan  ASA: II  Anesthesia Plan: General ETT   Post-op Pain Management:    Induction:   Airway Management Planned:   Additional Equipment:   Intra-op Plan:   Post-operative Plan:   Informed Consent: I have reviewed the patients History and Physical, chart, labs and discussed the procedure including the risks, benefits and alternatives for the proposed anesthesia with the patient or authorized representative who has indicated his/her understanding and acceptance.   Dental Advisory Given  Plan Discussed with: CRNA and Surgeon  Anesthesia Plan Comments:         Anesthesia Quick Evaluation

## 2011-08-05 NOTE — Anesthesia Postprocedure Evaluation (Signed)
  Anesthesia Post-op Note  Patient: [name] [name]  Procedure(s) Performed:  LAPAROSCOPIC ASSISTED VAGINAL HYSTERECTOMY; SALPINGO OOPHERECTOMY  Patient Location: [facility]  Anesthesia Type: General  Level of Consciousness: awake, alert  and oriented  Airway and Oxygen Therapy: Patient Spontanous Breathing  Post-op Pain: mild  Post-op Assessment: Patient's Cardiovascular Status Stable, Respiratory Function Stable, Patent Airway, No signs of Nausea or vomiting and Pain level controlled  Post-op Vital Signs: stable  Complications: No apparent anesthesia complications

## 2011-08-05 NOTE — Progress Notes (Signed)
Good pain relief, tolerating regular diet, ambulating  Blood pressure 128/80, pulse 65, temperature 97.4 F (36.3 C), temperature source Oral, resp. rate 18, height 5\' 3"  (1.6 m), weight 53.071 kg (117 lb), SpO2 95.00%. Lungs CTA Abd soft, good BS Ext PAS on  Stable Per orders

## 2011-08-05 NOTE — Anesthesia Postprocedure Evaluation (Signed)
  Anesthesia Post-op Note  Patient: [name] [name]  Procedure(s) Performed:  LAPAROSCOPIC ASSISTED VAGINAL HYSTERECTOMY; SALPINGO OOPHERECTOMY  Patient Location: PACU  Anesthesia Type: General  Level of Consciousness: awake, alert  and oriented  Airway and Oxygen Therapy: Patient Spontanous Breathing  Post-op Pain: none  Post-op Assessment: Post-op Vital signs reviewed, Patient's Cardiovascular Status Stable, Respiratory Function Stable, Patent Airway, No signs of Nausea or vomiting and Pain level controlled  Post-op Vital Signs: Reviewed and stable  Complications: No apparent anesthesia complications

## 2011-08-05 NOTE — Progress Notes (Signed)
Reviewed with patient LAVH and LSO to remove remaining ovary.  All questions answered. No changes to exam.

## 2011-08-06 ENCOUNTER — Encounter (HOSPITAL_COMMUNITY): Payer: Self-pay | Admitting: Obstetrics and Gynecology

## 2011-08-06 DIAGNOSIS — D219 Benign neoplasm of connective and other soft tissue, unspecified: Secondary | ICD-10-CM | POA: Diagnosis present

## 2011-08-06 DIAGNOSIS — N809 Endometriosis, unspecified: Secondary | ICD-10-CM | POA: Diagnosis present

## 2011-08-06 LAB — CBC
HCT: 35.4 % — ABNORMAL LOW (ref 36.0–46.0)
MCH: 33.3 pg (ref 26.0–34.0)
MCV: 98.3 fL (ref 78.0–100.0)
RBC: 3.6 MIL/uL — ABNORMAL LOW (ref 3.87–5.11)
RDW: 12.5 % (ref 11.5–15.5)
WBC: 12 10*3/uL — ABNORMAL HIGH (ref 4.0–10.5)

## 2011-08-06 MED ORDER — VALACYCLOVIR HCL 500 MG PO TABS: 500.0000 mg | ORAL_TABLET | Freq: Every day | ORAL | Status: AC

## 2011-08-06 MED ORDER — HYDROMORPHONE HCL 2 MG PO TABS: 2.0000 mg | ORAL_TABLET | Freq: Four times a day (QID) | ORAL | Status: AC | PRN

## 2011-08-06 MED ORDER — ACETAMINOPHEN 325 MG PO TABS: 650.0000 mg | ORAL_TABLET | Freq: Four times a day (QID) | ORAL | Status: AC | PRN

## 2011-08-06 NOTE — Discharge Summary (Signed)
Physician Discharge Summary  Patient ID: [name] [name] MRN: [id number] DOB/AGE: 1960/11/20 51 y.o.  Admit date: 08/05/2011 Discharge date: 08/06/2011  Admission Diagnoses:fibroids, endometriosis   Discharge Diagnoses: fibroids, endometriosis Active Problems:  * No active hospital problems. *    Discharged Condition: good  Hospital Course: Good post op resumption of bowel/bladder function. Ambulating with stable vital signs  Consults: none  Significant Diagnostic Studies: labs: post op labs pending  Treatments: IV hydration, analgesia: percocet and surgery: LAVH/LSO  Discharge Exam: Blood pressure 136/80, pulse 65, temperature 97.8 F (36.6 C), temperature source Oral, resp. rate 18, height 5\' 3"  (1.6 m), weight 53.071 kg (117 lb), SpO2 97.00%. Resp: clear to auscultation bilaterally GI: soft, good BS Incision/Wound:healing well  Disposition: Final discharge disposition not confirmed   Medication List  As of 08/06/2011 10:07 AM   ASK your doctor about these medications         almotriptan 12.5 MG tablet   Commonly known as: AXERT   Take 12.5 mg by mouth as needed. Patient repeats in 4 hours if needed for migraine      CHILDRENS CHEWABLE VITAMINS/FE PO   Take 1 tablet by mouth every morning. Flinstones with iron      diazepam 10 MG tablet   Commonly known as: VALIUM   Take 10 mg by mouth at bedtime as needed. To go to sleep with a migraine      Estroven Maximum Strength Tabs   Take 1 tablet by mouth at bedtime.      ferrous sulfate 324 (65 FE) MG Tbec   Take 65 mg by mouth at bedtime.      LOSEASONIQUE 0.1-0.02 & 0.01 MG tablet   Generic drug: Levonorgestrel-Ethinyl Estradiol   Take 1 tablet by mouth at bedtime.      nabumetone 500 MG tablet   Commonly known as: RELAFEN   Take 1,000 mg by mouth daily as needed. Patient may take alone for a mild migraine.  Takes with Axert for a more severe migraine.      OVER THE COUNTER MEDICATION   Apply 1 application  topically daily. Arbonne estrogen cream for night sweats      pantoprazole 40 MG tablet   Commonly known as: PROTONIX   Take 40 mg by mouth at bedtime.      PARoxetine 20 MG tablet   Commonly known as: PAXIL   Take 20 mg by mouth at bedtime.      polycarbophil 625 MG tablet   Commonly known as: FIBERCON   Take 625 mg by mouth at bedtime.      promethazine 25 MG tablet   Commonly known as: PHENERGAN   Take 25 mg by mouth daily as needed. For nausea associated with a migraine      SUMAtriptan 6 MG/0.5ML Soln injection   Commonly known as: IMITREX   Inject 6 mg into the skin daily as needed. For migraines.  Usually only requires one dose.      topiramate 100 MG tablet   Commonly known as: TOPAMAX   Take 100 mg by mouth at bedtime.      traZODone 50 MG tablet   Commonly known as: DESYREL   Take 50 mg by mouth at bedtime.      valACYclovir 500 MG tablet   Commonly known as: VALTREX   Take 500 mg by mouth at bedtime.      WOMENS MULTI VITAMIN & MINERAL Tabs   Take 1 tablet by mouth daily. [employer]  brand           Follow-up Information    Follow up with Jolan Mealor II,Loraina Stauffer E in 2 weeks.   Contact information:   [address] [place] [address] [phone]          Signed: Leslie Andrea 08/06/2011, 10:07 AM

## 2011-08-06 NOTE — Op Note (Signed)
NAMEDARRIANA, DEBOY NO.:  [id number]  MEDICAL RECORD NO.:  [id number]  LOCATION:  9315                          FACILITY:  [facility]  PHYSICIAN:  Guy Sandifer. Henderson Cloud, M.D. DATE OF BIRTH:  Jan 21, 1961  DATE OF PROCEDURE:  08/05/2011 DATE OF DISCHARGE:                              OPERATIVE REPORT   PREOPERATIVE DIAGNOSES:  Uterine leiomyomata and endometriosis.  POSTOPERATIVE DIAGNOSES:  Uterine leiomyomata and endometriosis.  PROCEDURE:  Laparoscopically-assisted vaginal hysterectomy and left salpingo-oophorectomy.  SURGEON:  Guy Sandifer. Henderson Cloud, MD  ASSISTANT:  Freddy Finner, MD  ANESTHESIA:  General with endotracheal intubation.  ESTIMATED BLOOD LOSS:  150 mL.  SPECIMENS:  Uterus, left tube, and ovary to pathology.  INDICATIONS AND CONSENT:  This patient is a 51 year old married white female, G0, P0 with known uterine leiomyomata and endometriosis with increasingly heavy and painful menses.  She has had a right salpingo- oophorectomy in the past.  After discussion of options, she is admitted for laparoscopically-assisted vaginal hysterectomy with left salpingo- oophorectomy to remove her remaining ovary.  Potential risks and complications were discussed preoperatively including, but not limited to infection, organ damage, bleeding requiring transfusion of blood products with HIV and hepatitis acquisition, DVT, PE, pneumonia, fistula formation, pelvic pain, painful intercourse, laparotomy.  Issues of menopause, risks and benefits of various treatments have also been reviewed.  All questions were answered and consent was signed on the chart.  FINDINGS:  Upper abdomen is grossly normal.  Uterus is about 8-10 weeks in size with a posterior fundal intramural fibroid.  Right tube and ovary have been removed and the staples from that procedure are apparent on the pelvic sidewall.  The left tube and ovary are normal.  There is a small pseudo window in the posterior  upper left cul-de-sac just medial to the cervical insertion of the left uterosacral ligament.  There are 2- 3 small bouts of probable endometriosis within it.  PROCEDURE:  The patient was taken to the operating room where she was identified, placed in dorsal supine position, and general anesthesia was induced via endotracheal intubation.  Her abdomen had been marked on the left side in the preop holding area.  Time-out was undertaken.  She was in the dorsal lithotomy position.  She was prepped abdominally and vaginally.  Bladder was straight catheterized.  Hulka tenaculum was placed.  The uterus was manipulated and she was draped in a sterile fashion.  The infraumbilical and suprapubic areas were injected in the midline with approximately 5 mL of 1.5% plain Marcaine.  Small infraumbilical incision was made.  Disposable Veress needle was placed on the first attempt and a good syringe and drop test were noted.  Two liters of gas were insufflated under low pressure with good tympany in the right upper quadrant.  Veress needle was removed.  The 10/11 XL bladeless disposable trocar sleeve was placed using direct visualization with the diagnostic laparoscope.  After placement, the operative laparoscope was used.  A small suprapubic incision was made in the midline and a 5-mm XL disposable trocar sleeve was placed under direct visualization without difficulty.  The above findings were noted. Course of the left ureter was noted to be well  clear of the area of surgery.  The left infundibulopelvic ligament was taken down with the EnSeal bipolar cautery cutting instrument.  Progressive bites were taken across the mesosalpinx, round ligament down to the vesicouterine peritoneum.  Vesicouterine peritoneum was taken down cephalad laterally. Good hemostasis was noted.  Suprapubic trocar sleeve was removed. Instruments were removed.  Attention was turned to the vagina. Posterior cul-de-sacs were entered  and cervix was circumscribed with unipolar cautery.  Mucosa was advanced sharply and bluntly.  Then, using the gyrus bipolar handheld cautery, the uterosacral ligaments were taken down followed by the bladder pillars, cardinal ligaments, and uterine vessels bilaterally.  Fundus was then delivered along with the left adnexa and the remaining pedicles were taken down.  All suture will be 0 Monocryl unless otherwise designated.  Uterosacral ligaments were then plicated the vaginal cuff bilaterally with suture and then plicated the midline with a third suture.  Cuff was closed with figure-of-eights. Foley catheter was placed in the bladder and clear urine was noted. Attention was returned to the abdomen.  Pneumoperitoneum was reintroduced.  Laparoscope was reinserted and the suprapubic trocar sleeve was reintroduced under direct visualization.  Irrigation was carried out.  Very minor oozing in the peritoneal edge was controlled with bipolar cautery.  Inspection reveals good hemostasis all around. The remaining 27 mL of 1.5% plain Marcaine was instilled into the peritoneal cavity.  Instruments were removed.  Pneumoperitoneum was reduced and the umbilical trocar sleeve was removed as well.  The umbilical incision was closed with 0 Vicryl suture in the fascial layer under good visualization.  Skin was closed with interrupted 4-0 Vicryl suture on both incisions.  Dermabond was placed on both as well.  All counts were correct.  The patient was awakened, taken to recovery room in stable condition.     Guy Sandifer Henderson Cloud, M.D.     JET/MEDQ  D:  08/05/2011  T:  08/06/2011  Job:  [id number]

## 2011-08-06 NOTE — Progress Notes (Signed)
Pain relief OK with percocet Passing flatus, ambulating, voiding  Blood pressure 136/80, pulse 65, temperature 97.8 F (36.6 C), temperature source Oral, resp. rate 18, height 5\' 3"  (1.6 m), weight 53.071 kg (117 lb), SpO2 97.00%.  Abd soft, good BS  CBC pending  D/C home after CBC Instructions given Dilaudid 2mg , #30, 1-2 q 6 hours prn with regular tylenol

## 2011-09-02 ENCOUNTER — Other Ambulatory Visit: Payer: Self-pay | Admitting: Neurology

## 2011-09-02 DIAGNOSIS — G4453 Primary thunderclap headache: Secondary | ICD-10-CM

## 2011-09-05 ENCOUNTER — Ambulatory Visit
Admission: RE | Admit: 2011-09-05 | Discharge: 2011-09-05 | Disposition: A | Discharge status: Home / Self Care | Payer: 59 | Source: Ambulatory Visit | Attending: Neurology | Admitting: Neurology

## 2011-09-05 DIAGNOSIS — G4453 Primary thunderclap headache: Secondary | ICD-10-CM

## 2012-03-25 ENCOUNTER — Other Ambulatory Visit (HOSPITAL_COMMUNITY): Payer: Self-pay | Admitting: Obstetrics and Gynecology

## 2012-03-25 DIAGNOSIS — Z1231 Encounter for screening mammogram for malignant neoplasm of breast: Secondary | ICD-10-CM

## 2012-05-04 ENCOUNTER — Ambulatory Visit (HOSPITAL_COMMUNITY)
Admission: RE | Admit: 2012-05-04 | Discharge: 2012-05-04 | Disposition: A | Discharge status: Home / Self Care | Payer: 59 | Source: Ambulatory Visit | Attending: Obstetrics and Gynecology | Admitting: Obstetrics and Gynecology

## 2012-05-04 DIAGNOSIS — Z1231 Encounter for screening mammogram for malignant neoplasm of breast: Secondary | ICD-10-CM

## 2013-04-07 ENCOUNTER — Other Ambulatory Visit (HOSPITAL_COMMUNITY): Payer: Self-pay | Admitting: Physician Assistant

## 2013-04-07 DIAGNOSIS — Z1231 Encounter for screening mammogram for malignant neoplasm of breast: Secondary | ICD-10-CM

## 2013-05-17 ENCOUNTER — Ambulatory Visit (HOSPITAL_COMMUNITY)
Admission: RE | Admit: 2013-05-17 | Discharge: 2013-05-17 | Disposition: A | Discharge status: Home / Self Care | Payer: 59 | Source: Ambulatory Visit | Attending: Physician Assistant | Admitting: Physician Assistant

## 2013-05-17 DIAGNOSIS — Z1231 Encounter for screening mammogram for malignant neoplasm of breast: Secondary | ICD-10-CM | POA: Insufficient documentation

## 2014-06-06 ENCOUNTER — Other Ambulatory Visit (HOSPITAL_COMMUNITY): Payer: Self-pay | Admitting: Physician Assistant

## 2014-06-06 DIAGNOSIS — Z1231 Encounter for screening mammogram for malignant neoplasm of breast: Secondary | ICD-10-CM

## 2014-06-27 ENCOUNTER — Other Ambulatory Visit (HOSPITAL_COMMUNITY): Payer: Self-pay | Admitting: Physician Assistant

## 2014-06-27 ENCOUNTER — Ambulatory Visit (HOSPITAL_COMMUNITY)
Admission: RE | Admit: 2014-06-27 | Discharge: 2014-06-27 | Disposition: A | Discharge status: Home / Self Care | Payer: 59 | Source: Ambulatory Visit | Attending: Physician Assistant | Admitting: Physician Assistant

## 2014-06-27 ENCOUNTER — Other Ambulatory Visit (HOSPITAL_COMMUNITY): Payer: Self-pay | Admitting: Obstetrics and Gynecology

## 2014-06-27 DIAGNOSIS — Z1231 Encounter for screening mammogram for malignant neoplasm of breast: Secondary | ICD-10-CM | POA: Insufficient documentation

## 2014-07-17 ENCOUNTER — Other Ambulatory Visit: Payer: Self-pay | Admitting: Obstetrics and Gynecology

## 2014-07-18 LAB — CYTOLOGY - PAP

## 2016-07-16 ENCOUNTER — Ambulatory Visit: Payer: PRIVATE HEALTH INSURANCE | Admitting: Physical Therapy

## 2016-07-23 ENCOUNTER — Ambulatory Visit: Payer: PRIVATE HEALTH INSURANCE | Attending: Obstetrics and Gynecology | Admitting: Physical Therapy

## 2016-07-23 ENCOUNTER — Encounter: Payer: Self-pay | Admitting: Physical Therapy

## 2016-07-23 DIAGNOSIS — M6281 Muscle weakness (generalized): Secondary | ICD-10-CM | POA: Diagnosis present

## 2016-07-23 DIAGNOSIS — M62838 Other muscle spasm: Secondary | ICD-10-CM | POA: Insufficient documentation

## 2016-07-23 NOTE — Therapy (Signed)
[facility] [address] [place], [place], [address] Phone: [phone]   Fax:  [phone]  Physical Therapy Evaluation  Patient Details  Name: [name] [name] MRN: [id number] Date of Birth: Sep 16, 1960 Referring Provider: Everlene Farrier, MD  Encounter Date: 07/23/2016      PT End of Session - 07/23/16 1711    Visit Number 1   Date for PT Re-Evaluation 10/01/16   PT Start Time [id number]   PT Stop Time 1702   PT Time Calculation (min) 45 min   Activity Tolerance Patient tolerated treatment well   Behavior During Therapy [facility] for tasks assessed/performed      Past Medical History:  Diagnosis Date  . Anxiety   . GERD (gastroesophageal reflux disease)   . Headache(784.0)   . Insomnia   . Migraines   . PONV (postoperative nausea and vomiting)     Past Surgical History:  Procedure Laterality Date  . APPENDECTOMY  1987  . DIAGNOSTIC LAPAROSCOPY  1987   Ovarian Cysts  . ELBOW ARTHROSCOPY    . LAPAROSCOPIC ASSISTED VAGINAL HYSTERECTOMY  08/05/2011   Procedure: LAPAROSCOPIC ASSISTED VAGINAL HYSTERECTOMY;  Surgeon: Allena Katz, MD;  Location: [facility];  Service: Gynecology;  Laterality: Bilateral;  . SALPINGOOPHORECTOMY  08/05/2011   Procedure: SALPINGO OOPHERECTOMY;  Surgeon: Allena Katz, MD;  Location: [facility];  Service: Gynecology;  Laterality: Left;  . TONSILLECTOMY      There were no vitals filed for this visit.       Subjective Assessment - 07/23/16 1623    Subjective Pt states she has been having pain with intercourse.  She has had hysterectomy Jan 2013 and gets cramps 2x/month 1-3 days.  Sometimes low back hurts.  Cramps can be very intense but sometimes just painful and annoying.     Pertinent History 10 surgeries for adhesions and ovarian cyst, Lt ovary removed, hysterectomy   Limitations House hold activities;Other (comment)  intercourse   Patient Stated Goals have intercourse with spouse,  not have severe lower abdominal cramps   Currently in Pain? Yes   Pain Score 9   cramps (feel like menstrul cramps   Pain Location Abdomen   Pain Orientation Lower;Right;Left  rotates sides   Pain Descriptors / Indicators Aching   Pain Type Chronic pain   Pain Onset 1 to 4 weeks ago   Pain Frequency Intermittent   Aggravating Factors  just regular monthly no matter what she is doing   Pain Relieving Factors heat, midol   Effect of Pain on Daily Activities all daily activities   Multiple Pain Sites Yes   Pain Score 7   Pain Location Vagina   Pain Orientation Lower   Pain Descriptors / Indicators Burning;Cramping   Pain Type Chronic pain   Pain Onset More than a month ago   Pain Frequency Intermittent   Aggravating Factors  intercourse, every 10 days will have cramps   Pain Relieving Factors nothing   Effect of Pain on Daily Activities unable to have intercourse with spouse            [facility] PT Assessment - 07/23/16 0001      Assessment   Medical Diagnosis N94.1 Dyspareunia   Referring Provider Everlene Farrier, MD   Prior Therapy no     Precautions   Precautions None     Restrictions   Weight Bearing Restrictions No     Balance Screen   Has the patient fallen in the  past 6 months No     Home Ecologist residence     Prior Function   Level of Independence Independent   Vocation Full time employment     Cognition   Overall Cognitive Status Within Functional Limits for tasks assessed   Attention Focused     Observation/Other Assessments   Focus on Therapeutic Outcomes (FOTO)  17% limitation     Posture/Postural Control   Posture/Postural Control No significant limitations     AROM   Overall AROM  Within functional limits for tasks performed     PROM   Overall PROM  Within functional limits for tasks performed     Strength   Strength Assessment Site Hip   Right/Left Hip Right   Right Hip Extension 5/5   Right Hip ABduction  4+/5   Right Hip ADduction 4+/5   Left Hip Extension 4/5   Left Hip ABduction 4/5   Left Hip ADduction 4/5     Palpation   Palpation comment SIJ Lt tender, bilateral hip flexors tender     Ambulation/Gait   Gait Pattern Within Functional Limits                 Pelvic Floor Special Questions - 07/23/16 0001    Prior Pelvic/Prostate Exam Yes   Date of Last Pelvic/Prostate Exam 06/23/15   Result Pelvic/Prostate Exam  no   Prior Pregnancies Yes   Number of Pregnancies 1  not full term   Episiotomy Performed No   Currently Sexually Active Yes   Is this Painful Yes   [name] Scale pain prevents any attempts at intercourse   Urinary Leakage No   Urinary urgency No   Urinary frequency 2-3 x / day   Fecal incontinence No  sometimes constipation   Fluid intake 4 glasses mostly water   Skin Integrity Intact   Perineal Body/Introitus  Normal   External Palpation tender to palpation in along pubic bone, anterior pelvic floor triangle bilaterally   Pelvic Floor Internal Exam Pt was informed and consent given to perform internal assessment and treatment   Exam Type Vaginal   Sensation normal   Palpation spasming, fascial restrictions, tender forchette and bulbocavernosis   Strength weak squeeze, no lift                    PT Short Term Goals - 07/23/16 2220      PT SHORT TERM GOAL #1   Title independent with HEP   Time 4   Period Weeks   Status New     PT SHORT TERM GOAL #2   Title bilateral hip strength 5/5 MMT for increased pelvic stablility during functional movements   Time 4   Period Weeks   Status New     PT SHORT TERM GOAL #3   Title reports 50% less pain when having cramps for improved function   Time 4   Period Weeks   Status New     PT SHORT TERM GOAL #4   Title Able to tolerated full internal pelvic assessment due to able to relax pelvic floor muscles   Time 4   Period Weeks   Status New           PT Long Term Goals - 07/23/16  2218      PT LONG TERM GOAL #1   Title Pt independent with final HEP   Time 10   Period Weeks   Status New  PT LONG TERM GOAL #2   Title reports able to tolerate deep penetration during intercouse for return to sexual activity with her spouse   Time 10   Period Weeks   Status New     PT LONG TERM GOAL #3   Title Reports 75% less painful cramps   Time 10   Period Weeks   Status New     PT LONG TERM GOAL #4   Title [name] scale reduced to 1 due to ability to relax pelvic floor.   Time 10   Period Weeks   Status New               Plan - 07/23/16 1713    Clinical Impression Statement Pt presents to clinic for low complexity eval.  Pt has had hysterectomy, Lt ovary removed, and multiple surgeries to remove abdominal adhesions.  She is curretly experiencing cramping regularly that can cause her to be unable to do normal activities and she is having dyspareunia and pelvic pain.  She has pain with initial penetration and is unable to get to deep penetration due to muscle cramping.  Pt is experiencing Lt hip weakness of 4/5 ext, abduciton, adduction, and 2/5 pelvic floor contraction.  Reports chronic constipation.  Muscle spasms and tenderness to ishchiocavernosis, bulbocavernosis, and forchette, and Lt SIJ also tender to palpation. Palpation of abdomen reveals some tnederness around hip flexors and pubic bone.  Pt has 17% limitation with ADLs.  Skilled PT needed to address impairments and return to functional activities     Rehab Potential Excellent   Clinical Impairments Affecting Rehab Potential multiple abdominal surgeries, constipation   PT Frequency 1x / week   PT Duration 12 weeks   PT Treatment/Interventions Biofeedback;Electrical Stimulation;Moist Heat;Therapeutic activities;Therapeutic exercise;Balance training;Neuromuscular re-education;Scar mobilization;Manual techniques;Dry needling;Taping   PT Next Visit Plan Abdominal massage, toileting techniques, pelvic floor  stretches, pelvic floor meditation, manual techniques   Recommended Other Services none   Consulted and Agree with Plan of Care Patient      Patient will benefit from skilled therapeutic intervention in order to improve the following deficits and impairments:  Decreased strength, Increased fascial restricitons, Pain, Increased muscle spasms, Impaired perceived functional ability, Decreased scar mobility  Visit Diagnosis: Other muscle spasm - Plan: PT plan of care cert/re-cert  Muscle weakness (generalized) - Plan: PT plan of care cert/re-cert     Problem List Patient Active Problem List   Diagnosis Date Noted  . Fibroids 08/06/2011  . Endometriosis 08/06/2011  . ESOPHAGEAL REFLUX 09/17/2010  . PUD 07/08/2010  . VERTIGO 07/08/2010  . PALPITATIONS 06/25/2010    Zannie Cove, PT 07/23/2016, 10:54 PM  [facility] [address] [place], [place], [address] Phone: [phone]   Fax:  [phone]  Name: [name] [name] MRN: [id number] Date of Birth: [birth year]

## 2016-08-06 ENCOUNTER — Ambulatory Visit: Payer: PRIVATE HEALTH INSURANCE | Admitting: Physical Therapy

## 2016-08-12 ENCOUNTER — Encounter: Payer: Self-pay | Admitting: Physical Therapy

## 2016-08-12 ENCOUNTER — Ambulatory Visit: Payer: PRIVATE HEALTH INSURANCE | Admitting: Physical Therapy

## 2016-08-12 DIAGNOSIS — M6281 Muscle weakness (generalized): Secondary | ICD-10-CM

## 2016-08-12 DIAGNOSIS — M62838 Other muscle spasm: Secondary | ICD-10-CM

## 2016-08-12 NOTE — Therapy (Signed)
[facility] [address] [place], [place], [address] Phone: [phone]   Fax:  [phone]  Physical Therapy Treatment  Patient Details  Name: [name] [name] MRN: [id number] Date of Birth: [birth year] Referring Provider: Everlene Farrier, MD  Encounter Date: 08/12/2016      PT End of Session - 08/12/16 1615    Visit Number 2   Date for PT Re-Evaluation 10/01/16   PT Start Time [id number]   PT Stop Time 1700   PT Time Calculation (min) 43 min   Activity Tolerance Patient tolerated treatment well   Behavior During Therapy [facility] for tasks assessed/performed      Past Medical History:  Diagnosis Date  . Anxiety   . GERD (gastroesophageal reflux disease)   . Headache(784.0)   . Insomnia   . Migraines   . PONV (postoperative nausea and vomiting)     Past Surgical History:  Procedure Laterality Date  . APPENDECTOMY  1987  . DIAGNOSTIC LAPAROSCOPY  1987   Ovarian Cysts  . ELBOW ARTHROSCOPY    . LAPAROSCOPIC ASSISTED VAGINAL HYSTERECTOMY  08/05/2011   Procedure: LAPAROSCOPIC ASSISTED VAGINAL HYSTERECTOMY;  Surgeon: Allena Katz, MD;  Location: [facility];  Service: Gynecology;  Laterality: Bilateral;  . SALPINGOOPHORECTOMY  08/05/2011   Procedure: SALPINGO OOPHERECTOMY;  Surgeon: Allena Katz, MD;  Location: [facility];  Service: Gynecology;  Laterality: Left;  . TONSILLECTOMY      There were no vitals filed for this visit.      Subjective Assessment - 08/12/16 1715    Subjective States no changes at this time.  Only did eval at initial visit   Pertinent History 10 surgeries for adhesions and ovarian cyst, Lt ovary removed, hysterectomy   Limitations House hold activities;Other (comment)   Patient Stated Goals have intercourse with spouse, not have severe lower abdominal cramps   Currently in Pain? No/denies                         [facility] Adult PT Treatment/Exercise - 08/12/16 0001      Neuro Re-ed    Neuro Re-ed Details  diaphragmatic breathing techniques     Knee/Hip Exercises: Stretches   Other Knee/Hip Stretches pelvic floor stretches: butterfly, piriformis, child's pose, SKTC, glutes, squats2x20sec hold     Manual Therapy   Manual Therapy Soft tissue mobilization;Myofascial release   Soft tissue mobilization rectus abdominis, hip flexors, diaphragm   Myofascial Release thoracic and bladder diaphragm release                PT Education - 08/12/16 1701    Education provided Yes   Person(s) Educated Patient   Methods Explanation;Demonstration;Verbal cues;Handout   Comprehension Verbalized understanding;Returned demonstration          PT Short Term Goals - 07/23/16 2220      PT SHORT TERM GOAL #1   Title independent with HEP   Time 4   Period Weeks   Status New     PT SHORT TERM GOAL #2   Title bilateral hip strength 5/5 MMT for increased pelvic stablility during functional movements   Time 4   Period Weeks   Status New     PT SHORT TERM GOAL #3   Title reports 50% less pain when having cramps for improved function   Time 4   Period Weeks   Status New     PT SHORT TERM GOAL #4  Title Able to tolerated full internal pelvic assessment due to able to relax pelvic floor muscles   Time 4   Period Weeks   Status New           PT Long Term Goals - 07/23/16 2218      PT LONG TERM GOAL #1   Title Pt independent with final HEP   Time 10   Period Weeks   Status New     PT LONG TERM GOAL #2   Title reports able to tolerate deep penetration during intercouse for return to sexual activity with her spouse   Time 10   Period Weeks   Status New     PT LONG TERM GOAL #3   Title Reports 75% less painful cramps   Time 10   Period Weeks   Status New     PT LONG TERM GOAL #4   Title [name] scale reduced to 1 due to ability to relax pelvic floor.   Time 10   Period Weeks   Status New               Plan - 08/12/16 1711     Clinical Impression Statement No progress to report due to first treatment since eval.  Pt was very restricted in abdomen with tenderness in diaphragm and hi pflexors Lt>Rt.  Able to demonstrate breathing technique and educated in performing pelvic floor relaxation   Rehab Potential Excellent   Clinical Impairments Affecting Rehab Potential multiple abdominal surgeries, constipation   PT Frequency 1x / week   PT Duration 12 weeks   PT Treatment/Interventions Biofeedback;Electrical Stimulation;Moist Heat;Therapeutic activities;Therapeutic exercise;Balance training;Neuromuscular re-education;Scar mobilization;Manual techniques;Dry needling;Taping   PT Next Visit Plan pelvic floor release, glutes, hip flexor stretch   PT Home Exercise Plan review and add if needed   Consulted and Agree with Plan of Care Patient      Patient will benefit from skilled therapeutic intervention in order to improve the following deficits and impairments:  Decreased strength, Increased fascial restricitons, Pain, Increased muscle spasms, Impaired perceived functional ability, Decreased scar mobility  Visit Diagnosis: Other muscle spasm  Muscle weakness (generalized)     Problem List Patient Active Problem List   Diagnosis Date Noted  . Fibroids 08/06/2011  . Endometriosis 08/06/2011  . ESOPHAGEAL REFLUX 09/17/2010  . PUD 07/08/2010  . VERTIGO 07/08/2010  . PALPITATIONS 06/25/2010    Zannie Cove, PT 08/12/2016, 5:16 PM  [facility] [address] [place], [place], [address] Phone: [phone]   Fax:  [phone]  Name: [name] [name] MRN: [id number] Date of Birth: [birth year]

## 2016-08-12 NOTE — Patient Instructions (Signed)
.  Balloon Breath    Place hands LIGHTLY on belly below navel. Imagine a balloon inside belly. Blow up balloon on breath IN, deflate balloon on breath OUT. Contract abdominals slightly to assist breath OUT. Time _10__ minutes.  Practice every day  Copyright  VHI. All rights reserved.     [employer] - pelvic floor relaxation meditation * find on [employer]     Position yourself as shown grabbing onto feet or behind the knees. You should feel a gentle stretch. Breathe in and allow the pelvic floor muscles to relax. Hold 1 min. 2 times per day.  Adductors, Frog Squat    Crouch with elbows inside knees . Gently push knees outward. Hold _30__ seconds.1 Repeat __1_ times per session. Do _2__ sessions per day.  Copyright  VHI. All rights reserved.   BACK: Child's Pose (Sciatica)    Sit in knee-chest position and reach arms forward. Separate knees for comfort. Hold position for _60__ sec. Repeat _2__ times. Do _2__ times per day.  Copyright  VHI. All rights reserved.   Piriformis Stretch    Lying on back, pull right knee toward opposite shoulder. Hold _30___ seconds. Repeat __2__ times. Do _1___ sessions per day.  http://[email or web address].exer.us/258   Copyright  VHI. All rights reserved.   Piriformis Stretch, Supine    Lie supine, one ankle crossed onto opposite knee. Holding bottom leg behind knee, gently pull legs toward chest until stretch is felt in buttock of top leg. Hold _30__ seconds. For deeper stretch gently push top knee away from body.  Repeat _2__ times per session. Do _2__ sessions per day.  Copyright  VHI. All rights reserved.    Supine Knee-to-Chest, Unilateral    Lie on back, hands clasped behind one knee. Pull knee in toward chest until a comfortable stretch is felt in lower back and buttocks. Hold 30___ seconds.  Repeat __2_ times per session. Do _1__ sessions per day.    Copyright  VHI. All rights reserved.  Butterfly, Supine    Lie on  back, feet together. Lower knees toward floor. Hold 60___ seconds. Repeat _2__ times per session. Do _1__ sessions per day.  Copyright  VHI. All rights reserved.

## 2016-08-19 ENCOUNTER — Ambulatory Visit: Payer: PRIVATE HEALTH INSURANCE | Admitting: Physical Therapy

## 2016-08-19 DIAGNOSIS — M62838 Other muscle spasm: Secondary | ICD-10-CM | POA: Diagnosis not present

## 2016-08-19 DIAGNOSIS — M6281 Muscle weakness (generalized): Secondary | ICD-10-CM

## 2016-08-20 ENCOUNTER — Encounter: Payer: Self-pay | Admitting: Physical Therapy

## 2016-08-20 NOTE — Therapy (Addendum)
[facility] [address], [place], [place], [address] Phone: [phone]   Fax:  [phone]  Physical Therapy Treatment  Patient Details  Name: [name] [name] MRN: [id number] Date of Birth: [birth year] Referring Provider: Everlene Farrier, MD  Encounter Date: 08/19/2016      PT End of Session - 08/20/16 0816    Visit Number 3   Date for PT Re-Evaluation 10/01/16   PT Start Time 5573   PT Stop Time 1700   PT Time Calculation (min) 44 min   Activity Tolerance Patient tolerated treatment well   Behavior During Therapy [facility] for tasks assessed/performed      Past Medical History:  Diagnosis Date  . Anxiety   . GERD (gastroesophageal reflux disease)   . Headache(784.0)   . Insomnia   . Migraines   . PONV (postoperative nausea and vomiting)     Past Surgical History:  Procedure Laterality Date  . APPENDECTOMY  1987  . DIAGNOSTIC LAPAROSCOPY  1987   Ovarian Cysts  . ELBOW ARTHROSCOPY    . LAPAROSCOPIC ASSISTED VAGINAL HYSTERECTOMY  08/05/2011   Procedure: LAPAROSCOPIC ASSISTED VAGINAL HYSTERECTOMY;  Surgeon: Allena Katz, MD;  Location: [facility];  Service: Gynecology;  Laterality: Bilateral;  . SALPINGOOPHORECTOMY  08/05/2011   Procedure: SALPINGO OOPHERECTOMY;  Surgeon: Allena Katz, MD;  Location: [facility];  Service: Gynecology;  Laterality: Left;  . TONSILLECTOMY      There were no vitals filed for this visit.      Subjective Assessment - 08/20/16 0815    Subjective Unsure if any changes, has not had intercourse.  Is having cramps on Rt side of lower abdomen today.   Pertinent History 10 surgeries for adhesions and ovarian cyst, Lt ovary removed, hysterectomy   Limitations House hold activities;Other (comment)   Patient Stated Goals have intercourse with spouse, not have severe lower abdominal cramps   Currently in Pain? No/denies                         [facility] Adult PT  Treatment/Exercise - 08/20/16 0001      Lumbar Exercises: Stretches   Press Ups 5 reps;10 seconds     Manual Therapy   Manual Therapy Soft tissue mobilization;Myofascial release   Soft tissue mobilization lumbar paraspinals, QL, TFL, rectus abdominis, hip flexors, diaphragm   Myofascial Release abdomen, diphragm                  PT Short Term Goals - 08/20/16 0818      PT SHORT TERM GOAL #1   Title independent with HEP   Time 4   Period Weeks   Status On-going     PT SHORT TERM GOAL #2   Title bilateral hip strength 5/5 MMT for increased pelvic stablility during functional movements   Time 4   Period Weeks   Status On-going     PT SHORT TERM GOAL #3   Title reports 50% less pain when having cramps for improved function   Time 4   Period Weeks   Status On-going     PT SHORT TERM GOAL #4   Title Able to tolerated full internal pelvic assessment due to able to relax pelvic floor muscles   Time 4   Period Weeks   Status On-going           PT Long Term Goals - 07/23/16 2218      PT  LONG TERM GOAL #1   Title Pt independent with final HEP   Time 10   Period Weeks   Status New     PT LONG TERM GOAL #2   Title reports able to tolerate deep penetration during intercouse for return to sexual activity with her spouse   Time 10   Period Weeks   Status New     PT LONG TERM GOAL #3   Title Reports 75% less painful cramps   Time 10   Period Weeks   Status New     PT LONG TERM GOAL #4   Title [name] scale reduced to 1 due to ability to relax pelvic floor.   Time 10   Period Weeks   Status New               Plan - 08/20/16 5361    Clinical Impression Statement Pt had decreased cramping after treatment today.  Pt had improved lumbar extension and added press ups to HEP.  Continues to have a lot of tightness in rectus abdominis and decreased lumbar lordosis.   Rehab Potential Excellent   Clinical Impairments Affecting Rehab Potential multiple  abdominal surgeries, constipation   PT Frequency 1x / week   PT Duration 12 weeks   PT Treatment/Interventions Biofeedback;Electrical Stimulation;Moist Heat;Therapeutic activities;Therapeutic exercise;Balance training;Neuromuscular re-education;Scar mobilization;Manual techniques;Dry needling;Taping   PT Next Visit Plan sitting on ball with bulging, STM to pelvic floor external and internal if tolerated, education on self massage, review prone press ups   PT Home Exercise Plan added prone press ups   Consulted and Agree with Plan of Care Patient      Patient will benefit from skilled therapeutic intervention in order to improve the following deficits and impairments:  Decreased strength, Increased fascial restricitons, Pain, Increased muscle spasms, Impaired perceived functional ability, Decreased scar mobility  Visit Diagnosis: Other muscle spasm  Muscle weakness (generalized)     Problem List Patient Active Problem List   Diagnosis Date Noted  . Fibroids 08/06/2011  . Endometriosis 08/06/2011  . ESOPHAGEAL REFLUX 09/17/2010  . PUD 07/08/2010  . VERTIGO 07/08/2010  . PALPITATIONS 06/25/2010    Zannie Cove, PT 08/20/2016, 12:31 PM  [facility] [address] [place], [place], [address] Phone: [phone]   Fax:  [phone]  Name: [name] [name] MRN: [id number] Date of Birth: April 22, 1961  PHYSICAL THERAPY DISCHARGE SUMMARY  Visits from Start of Care: 3  Current functional level related to goals / functional outcomes: See goals above for most recent update   Remaining deficits: As listed above   Education / Equipment: HEP  Plan: Patient agrees to discharge.  Patient goals were not met. Patient is being discharged due to not returning since the last visit.  ?????

## 2022-04-29 LAB — EXTERNAL GENERIC LAB PROCEDURE: COLOGUARD: NEGATIVE

## 2023-02-13 ENCOUNTER — Encounter: Payer: Self-pay | Admitting: Gastroenterology

## 2023-04-23 ENCOUNTER — Other Ambulatory Visit: Payer: Self-pay | Admitting: Physical Medicine and Rehabilitation

## 2023-04-23 DIAGNOSIS — R1310 Dysphagia, unspecified: Secondary | ICD-10-CM

## 2023-05-15 ENCOUNTER — Ambulatory Visit: Payer: PRIVATE HEALTH INSURANCE | Admitting: Gastroenterology
